# Patient Record
Sex: Female | Born: 1935 | Race: White | Hispanic: No | Smoking: Former smoker
Health system: Southern US, Community
[De-identification: ages and names within clinical notes are randomized; demographics above are authoritative.]

## PROBLEM LIST (undated history)

## (undated) DIAGNOSIS — F0390 Unspecified dementia without behavioral disturbance: Secondary | ICD-10-CM

## (undated) DIAGNOSIS — E785 Hyperlipidemia, unspecified: Secondary | ICD-10-CM

## (undated) DIAGNOSIS — J302 Other seasonal allergic rhinitis: Secondary | ICD-10-CM

## (undated) DIAGNOSIS — S069XAA Unspecified intracranial injury with loss of consciousness status unknown, initial encounter: Secondary | ICD-10-CM

## (undated) DIAGNOSIS — S069X9A Unspecified intracranial injury with loss of consciousness of unspecified duration, initial encounter: Secondary | ICD-10-CM

## (undated) DIAGNOSIS — F419 Anxiety disorder, unspecified: Secondary | ICD-10-CM

## (undated) DIAGNOSIS — E079 Disorder of thyroid, unspecified: Secondary | ICD-10-CM

## (undated) HISTORY — PX: ABDOMINAL HYSTERECTOMY: SHX81

---

## 2014-09-13 ENCOUNTER — Emergency Department: Payer: Self-pay | Admitting: Emergency Medicine

## 2014-09-13 LAB — CBC
HCT: 40.5 % (ref 35.0–47.0)
HGB: 13 g/dL (ref 12.0–16.0)
MCH: 29.5 pg (ref 26.0–34.0)
MCHC: 32 g/dL (ref 32.0–36.0)
MCV: 92 fL (ref 80–100)
Platelet: 325 10*3/uL (ref 150–440)
RBC: 4.4 10*6/uL (ref 3.80–5.20)
RDW: 13.9 % (ref 11.5–14.5)
WBC: 7.1 10*3/uL (ref 3.6–11.0)

## 2014-09-13 LAB — BASIC METABOLIC PANEL
ANION GAP: 7 (ref 7–16)
BUN: 21 mg/dL — ABNORMAL HIGH (ref 7–18)
CALCIUM: 8.2 mg/dL — AB (ref 8.5–10.1)
Chloride: 107 mmol/L (ref 98–107)
Co2: 25 mmol/L (ref 21–32)
Creatinine: 0.97 mg/dL (ref 0.60–1.30)
EGFR (Non-African Amer.): 58 — ABNORMAL LOW
Glucose: 83 mg/dL (ref 65–99)
Osmolality: 280 (ref 275–301)
Potassium: 4.3 mmol/L (ref 3.5–5.1)
SODIUM: 139 mmol/L (ref 136–145)

## 2014-09-13 LAB — PROTIME-INR
INR: 1.1
Prothrombin Time: 13.8 secs (ref 11.5–14.7)

## 2014-09-13 LAB — TROPONIN I: Troponin-I: <0.02 ng/mL

## 2017-12-01 ENCOUNTER — Emergency Department
Admission: EM | Admit: 2017-12-01 | Discharge: 2017-12-02 | Disposition: A | Discharge status: Home / Self Care | Payer: Medicare Other | Attending: Emergency Medicine | Admitting: Emergency Medicine

## 2017-12-01 ENCOUNTER — Encounter: Payer: Self-pay | Admitting: Emergency Medicine

## 2017-12-01 ENCOUNTER — Other Ambulatory Visit: Payer: Self-pay

## 2017-12-01 DIAGNOSIS — Z87891 Personal history of nicotine dependence: Secondary | ICD-10-CM | POA: Diagnosis not present

## 2017-12-01 DIAGNOSIS — R109 Unspecified abdominal pain: Secondary | ICD-10-CM | POA: Diagnosis present

## 2017-12-01 HISTORY — DX: Unspecified dementia, unspecified severity, without behavioral disturbance, psychotic disturbance, mood disturbance, and anxiety: F03.90

## 2017-12-01 HISTORY — DX: Unspecified intracranial injury with loss of consciousness status unknown, initial encounter: S06.9XAA

## 2017-12-01 HISTORY — DX: Hyperlipidemia, unspecified: E78.5

## 2017-12-01 HISTORY — DX: Disorder of thyroid, unspecified: E07.9

## 2017-12-01 HISTORY — DX: Unspecified intracranial injury with loss of consciousness of unspecified duration, initial encounter: S06.9X9A

## 2017-12-01 HISTORY — DX: Anxiety disorder, unspecified: F41.9

## 2017-12-01 HISTORY — DX: Other seasonal allergic rhinitis: J30.2

## 2017-12-01 LAB — CBC
HCT: 37.5 % (ref 35.0–47.0)
HEMOGLOBIN: 12.8 g/dL (ref 12.0–16.0)
MCH: 31 pg (ref 26.0–34.0)
MCHC: 34.2 g/dL (ref 32.0–36.0)
MCV: 90.8 fL (ref 80.0–100.0)
PLATELETS: 432 10*3/uL (ref 150–440)
RBC: 4.13 MIL/uL (ref 3.80–5.20)
RDW: 14.6 % — ABNORMAL HIGH (ref 11.5–14.5)
WBC: 7.7 10*3/uL (ref 3.6–11.0)

## 2017-12-01 LAB — COMPREHENSIVE METABOLIC PANEL
ALK PHOS: 71 U/L (ref 38–126)
ALT: 14 U/L (ref 14–54)
ANION GAP: 7 (ref 5–15)
AST: 19 U/L (ref 15–41)
Albumin: 3.9 g/dL (ref 3.5–5.0)
BILIRUBIN TOTAL: 0.8 mg/dL (ref 0.3–1.2)
BUN: 34 mg/dL — ABNORMAL HIGH (ref 6–20)
CALCIUM: 9 mg/dL (ref 8.9–10.3)
CO2: 25 mmol/L (ref 22–32)
CREATININE: 0.78 mg/dL (ref 0.44–1.00)
Chloride: 103 mmol/L (ref 101–111)
Glucose, Bld: 94 mg/dL (ref 65–99)
Potassium: 4.1 mmol/L (ref 3.5–5.1)
SODIUM: 135 mmol/L (ref 135–145)
TOTAL PROTEIN: 7.1 g/dL (ref 6.5–8.1)

## 2017-12-01 LAB — TROPONIN I

## 2017-12-01 LAB — LIPASE, BLOOD: Lipase: 42 U/L (ref 11–51)
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
## 2017-12-02 ENCOUNTER — Emergency Department: Payer: Medicare Other

## 2017-12-02 DIAGNOSIS — R109 Unspecified abdominal pain: Secondary | ICD-10-CM | POA: Diagnosis not present

## 2017-12-02 LAB — URINALYSIS, COMPLETE (UACMP) WITH MICROSCOPIC
Bilirubin Urine: NEGATIVE
Glucose, UA: NEGATIVE mg/dL
Hgb urine dipstick: NEGATIVE
KETONES UR: NEGATIVE mg/dL
LEUKOCYTES UA: NEGATIVE
NITRITE: NEGATIVE
PROTEIN: NEGATIVE mg/dL
SPECIFIC GRAVITY, URINE: 1.025 (ref 1.005–1.030)
Squamous Epithelial / LPF: NONE SEEN
pH: 5 (ref 5.0–8.0)

## 2017-12-02 MED ORDER — IOPAMIDOL (ISOVUE-300) INJECTION 61%
30.0000 mL | Freq: Once | INTRAVENOUS | Status: AC | PRN
  Administered 2017-12-02: 30 mL via ORAL

## 2017-12-02 MED ORDER — IOPAMIDOL (ISOVUE-300) INJECTION 61%
100.0000 mL | Freq: Once | INTRAVENOUS | Status: AC | PRN
  Administered 2017-12-02: 100 mL via INTRAVENOUS

## 2017-12-02 NOTE — ED Provider Notes (Signed)
[facility] Emergency Department Provider Note   First MD Initiated Contact with Patient 12/01/17 2357     (approximate)  I have reviewed the triage vital signs and the nursing notes.  Level 5 caveat: History limited secondary to dementia HISTORY  Chief Complaint Abdominal Pain   HPI Tonette Ledererherese Jacques is a 84 y.o. female with below list of chronic medical conditions presents to the emergency department 1 day history of abdominal pain of unknown location as per the patient's son.  The patient's son that the patient has had no vomiting however that the patient had diarrhea which resolved a day ago.  No fever afebrile on presentation unable to obtain a pain score secondary to dementia   Past Medical History:  Diagnosis Date  . Anxiety   . Dementia   . Hyperlipidemia   . Seasonal allergies   . TBI (traumatic brain injury) (HCC)   . Thyroid disease     There are no active problems to display for this patient.   Past Surgical History:  Procedure Laterality Date  . ABDOMINAL HYSTERECTOMY      Prior to Admission medications   Not on File    Allergies Sulfa antibiotics  No family history on file.  Social History Social History   Tobacco Use  . Smoking status: Former Games developermoker  . Smokeless tobacco: Never Used  Substance Use Topics  . Alcohol use: No    Frequency: Never  . Drug use: Not on file    Review of Systems Constitutional: No fever/chills Eyes: No visual changes. ENT: No sore throat. Cardiovascular: Denies chest pain. Respiratory: Denies shortness of breath. Gastrointestinal: No abdominal pain.  No nausea, no vomiting.  No diarrhea.  No constipation. Genitourinary: Negative for dysuria. Musculoskeletal: Negative for neck pain.  Negative for back pain. Integumentary: Negative for rash. Neurological: Negative for headaches, focal weakness or numbness.  ____________________________________________   PHYSICAL EXAM:  VITAL  SIGNS: ED Triage Vitals  Enc Vitals Group     BP 12/01/17 2059 (!) 158/95     Pulse Rate 12/01/17 2059 74     Resp 12/01/17 2059 18     Temp 12/01/17 2059 98.2 F (36.8 C)     Temp Source 12/01/17 2059 Oral     SpO2 12/01/17 2059 97 %     Weight 12/01/17 2054 63.5 kg (140 lb)     Height 12/01/17 2054 1.549 m (5\' 1" )     Head Circumference --      Peak Flow --      Pain Score --      Pain Loc --      Pain Edu? --      Excl. in GC? --     Constitutional: Alert Well appearing and in no acute distress. Eyes: Conjunctivae are normal.  Head: Atraumatic. Mouth/Throat: Mucous membranes are moist.  Oropharynx non-erythematous. Neck: No stridor.  Cardiovascular: Normal rate, regular rhythm. Good peripheral circulation. Grossly normal heart sounds. Respiratory: Normal respiratory effort.  No retractions. Lungs CTAB. Gastrointestinal: Soft and nontender. No distention.  Musculoskeletal: No lower extremity tenderness nor edema. No gross deformities of extremities. Neurologic:  Normal speech and language. No gross focal neurologic deficits are appreciated.  Skin:  Skin is warm, dry and intact. No rash noted. Psychiatric: Mood and affect are normal. Speech and behavior are normal.**}  ____________________________________________   LABS (all labs ordered are listed, but only abnormal results are displayed)  Labs Reviewed  COMPREHENSIVE METABOLIC PANEL - Abnormal; Notable for  the following components:      Result Value   BUN 34 (*)    All other components within normal limits  CBC - Abnormal; Notable for the following components:   RDW 14.6 (*)    All other components within normal limits  URINALYSIS, COMPLETE (UACMP) WITH MICROSCOPIC - Abnormal; Notable for the following components:   Color, Urine YELLOW (*)    APPearance CLEAR (*)    Bacteria, UA RARE (*)    All other components within normal limits  LIPASE, BLOOD  TROPONIN I    ____________________________________________  EKG  ED ECG REPORT I, [place] N BROWN, the attending physician, personally viewed and interpreted this ECG.   Date: 12/02/2017  EKG Time: 9:06 PM  Rate: 75  Rhythm: Normal sinus rhythm  Axis: Normal  Intervals: Normal  ST&T Change: None  ____________________________________________  RADIOLOGY I, [place] N BROWN, personally viewed and evaluated these images (plain radiographs) as part of my medical decision making, as well as reviewing the written report by the radiologist.  Ct Abdomen Pelvis W Contrast  Result Date: 12/02/2017 CLINICAL DATA:  Abdominal pain for a few days. EXAM: CT ABDOMEN AND PELVIS WITH CONTRAST TECHNIQUE: Multidetector CT imaging of the abdomen and pelvis was performed using the standard protocol following bolus administration of intravenous contrast. CONTRAST:  ISOVUE-300 IOPAMIDOL (ISOVUE-300) INJECTION 61% COMPARISON:  None. FINDINGS: Lower chest: Lung bases are clear. Hepatobiliary: No focal liver abnormality is seen. Status post cholecystectomy. No biliary dilatation. Pancreas: Unremarkable. No pancreatic ductal dilatation or surrounding inflammatory changes. Spleen: Normal in size without focal abnormality. Adrenals/Urinary Tract: No adrenal gland nodules. Subcentimeter cysts in the kidneys. Nephrograms are symmetrical and homogeneous. No hydronephrosis or hydroureter. Bladder wall is diffusely thickened. Multiple bladder diverticula are present. This may represent bladder wall hypertrophy due to outlet obstruction or neurogenic bladder. Cystitis could also have this appearance. Stomach/Bowel: Stomach is within normal limits. Appendix appears normal. No evidence of bowel wall thickening, distention, or inflammatory changes. Vascular/Lymphatic: Aortic atherosclerosis. No enlarged abdominal or pelvic lymph nodes. Reproductive: Status post hysterectomy. No adnexal masses. Other: No abdominal wall hernia or  abnormality. No abdominopelvic ascites. Musculoskeletal: Degenerative changes in the spine. Old fracture deformity of the left inferior pubic ramus. Old rib fractures. No destructive bone lesions. IMPRESSION: 1. Bladder wall hypertrophy with trabeculation and diverticular formation. This likely represents changes due to outlet obstruction or neurogenic bladder. Cystitis less likely. 2. No bowel obstruction or inflammation. 3. Aortic atherosclerosis. Electronically Signed   By: Burman Nieves M.D.   On: 12/02/2017 02:35    Procedures   ____________________________________________   INITIAL IMPRESSION / ASSESSMENT AND PLAN / ED COURSE  As part of my medical decision making, I reviewed the following data within the electronic MEDICAL RECORD [id number] year old female presenting with abdominal pain no clear etiology for the patient's abdominal pain identified laboratory data on revealed no acute abnormalities. ____________________________________________  FINAL CLINICAL IMPRESSION(S) / ED DIAGNOSES  Final diagnoses:  Abdominal pain, unspecified abdominal location     MEDICATIONS GIVEN DURING THIS VISIT:  Medications  iopamidol (ISOVUE-300) 61 % injection 30 mL (30 mLs Oral Contrast Given 12/02/17 0058)  iopamidol (ISOVUE-300) 61 % injection 100 mL (100 mLs Intravenous Contrast Given 12/02/17 0208)     ED Discharge Orders    None       Note:  This document was prepared using Dragon voice recognition software and may include unintentional dictation errors.    Darci Current, MD 12/02/17 [phone]

## 2017-12-12 DIAGNOSIS — Z87891 Personal history of nicotine dependence: Secondary | ICD-10-CM | POA: Diagnosis not present

## 2017-12-12 DIAGNOSIS — B349 Viral infection, unspecified: Secondary | ICD-10-CM | POA: Diagnosis not present

## 2017-12-12 DIAGNOSIS — F0390 Unspecified dementia without behavioral disturbance: Secondary | ICD-10-CM | POA: Insufficient documentation

## 2017-12-12 DIAGNOSIS — R1010 Upper abdominal pain, unspecified: Secondary | ICD-10-CM | POA: Diagnosis present

## 2017-12-12 LAB — CBC
HCT: 36.8 % (ref 35.0–47.0)
Hemoglobin: 12.5 g/dL (ref 12.0–16.0)
MCH: 31 pg (ref 26.0–34.0)
MCHC: 34 g/dL (ref 32.0–36.0)
MCV: 91.2 fL (ref 80.0–100.0)
PLATELETS: 419 10*3/uL (ref 150–440)
RBC: 4.03 MIL/uL (ref 3.80–5.20)
RDW: 14.7 % — AB (ref 11.5–14.5)
WBC: 7.3 10*3/uL (ref 3.6–11.0)

## 2017-12-12 LAB — COMPREHENSIVE METABOLIC PANEL
ALK PHOS: 78 U/L (ref 38–126)
ALT: 14 U/L (ref 14–54)
AST: 19 U/L (ref 15–41)
Albumin: 3.8 g/dL (ref 3.5–5.0)
Anion gap: 8 (ref 5–15)
BILIRUBIN TOTAL: 0.8 mg/dL (ref 0.3–1.2)
BUN: 37 mg/dL — AB (ref 6–20)
CO2: 27 mmol/L (ref 22–32)
CREATININE: 0.85 mg/dL (ref 0.44–1.00)
Calcium: 9 mg/dL (ref 8.9–10.3)
Chloride: 102 mmol/L (ref 101–111)
GFR calc Af Amer: >60 mL/min (ref >60)
Glucose, Bld: 95 mg/dL (ref 65–99)
Potassium: 4.5 mmol/L (ref 3.5–5.1)
Sodium: 137 mmol/L (ref 135–145)
TOTAL PROTEIN: 7.1 g/dL (ref 6.5–8.1)

## 2017-12-12 LAB — LIPASE, BLOOD: Lipase: 41 U/L (ref 11–51)

## 2017-12-12 NOTE — ED Triage Notes (Signed)
Per daughter-in-law, pt has been having epigastric pain, with a fever of 100.2.  Per daughter-in-law, she gave pt tylenol.  Denies vomiting or diarrhea at this time.  Pt is poor historian due to dementia, daughter-in-law able to provide most information.  Pt appears in NAD at this time.

## 2017-12-13 ENCOUNTER — Encounter: Payer: Self-pay | Admitting: Radiology

## 2017-12-13 ENCOUNTER — Emergency Department: Payer: Medicare Other

## 2017-12-13 ENCOUNTER — Emergency Department
Admission: EM | Admit: 2017-12-13 | Discharge: 2017-12-13 | Disposition: A | Discharge status: Home / Self Care | Payer: Medicare Other | Attending: Emergency Medicine | Admitting: Emergency Medicine

## 2017-12-13 DIAGNOSIS — B349 Viral infection, unspecified: Secondary | ICD-10-CM | POA: Diagnosis not present

## 2017-12-13 LAB — URINALYSIS, COMPLETE (UACMP) WITH MICROSCOPIC
Bacteria, UA: NONE SEEN
Bilirubin Urine: NEGATIVE
GLUCOSE, UA: NEGATIVE mg/dL
Hgb urine dipstick: NEGATIVE
KETONES UR: NEGATIVE mg/dL
Leukocytes, UA: NEGATIVE
Nitrite: NEGATIVE
Protein, ur: NEGATIVE mg/dL
SQUAMOUS EPITHELIAL / LPF: NONE SEEN
Specific Gravity, Urine: 1.024 (ref 1.005–1.030)
pH: 5 (ref 5.0–8.0)

## 2017-12-13 LAB — INFLUENZA PANEL BY PCR (TYPE A & B)
INFLAPCR: NEGATIVE
INFLBPCR: NEGATIVE

## 2017-12-13 LAB — LACTIC ACID, PLASMA: Lactic Acid, Venous: 1.2 mmol/L (ref 0.5–1.9)

## 2017-12-13 LAB — TROPONIN I

## 2017-12-13 MED ORDER — OSELTAMIVIR PHOSPHATE 6 MG/ML PO SUSR
30.0000 mg | Freq: Once | ORAL | Status: AC
  Administered 2017-12-13: 30 mg via ORAL
  Filled 2017-12-13: qty 12.5

## 2017-12-13 MED ORDER — OSELTAMIVIR PHOSPHATE 6 MG/ML PO SUSR: 30.0000 mg | Freq: Two times a day (BID) | ORAL | 0 refills | Status: AC

## 2017-12-13 MED ORDER — IOPAMIDOL (ISOVUE-300) INJECTION 61%
75.0000 mL | Freq: Once | INTRAVENOUS | Status: AC | PRN
  Administered 2017-12-13: 75 mL via INTRAVENOUS

## 2017-12-13 NOTE — Discharge Instructions (Signed)
I will call you back with the results of the flu test later on this morning.  Please follow-up in clinic within 2 days to establish care with primary care.  Return to the emergency department sooner for any new or worsening symptoms such as worsening fevers, chills, if she is not behaving normally, or for any other issues whatsoever.  It was a pleasure to take care of you today, and thank you for coming to our emergency department.  If you have any questions or concerns before leaving please ask the nurse to grab me and I'm more than happy to go through your aftercare instructions again.  If you were prescribed any opioid pain medication today such as Norco, Vicodin, Percocet, morphine, hydrocodone, or oxycodone please make sure you do not drive when you are taking this medication as it can alter your ability to drive safely.  If you have any concerns once you are home that you are not improving or are in fact getting worse before you can make it to your follow-up appointment, please do not hesitate to call 911 and come back for further evaluation.  Merrily Brittle, MD  Results for orders placed or performed during the hospital encounter of 12/13/17  Lipase, blood  Result Value Ref Range   Lipase 41 11 - 51 U/L  Comprehensive metabolic panel  Result Value Ref Range   Sodium 137 135 - 145 mmol/L   Potassium 4.5 3.5 - 5.1 mmol/L   Chloride 102 101 - 111 mmol/L   CO2 27 22 - 32 mmol/L   Glucose, Bld 95 65 - 99 mg/dL   BUN 37 (H) 6 - 20 mg/dL   Creatinine, Ser 1.61 0.44 - 1.00 mg/dL   Calcium 9.0 8.9 - 09.6 mg/dL   Total Protein 7.1 6.5 - 8.1 g/dL   Albumin 3.8 3.5 - 5.0 g/dL   AST 19 15 - 41 U/L   ALT 14 14 - 54 U/L   Alkaline Phosphatase 78 38 - 126 U/L   Total Bilirubin 0.8 0.3 - 1.2 mg/dL   GFR calc non Af Amer >60 >60 mL/min   GFR calc Af Amer >60 >60 mL/min   Anion gap 8 5 - 15  CBC  Result Value Ref Range   WBC 7.3 3.6 - 11.0 K/uL   RBC 4.03 3.80 - 5.20 MIL/uL   Hemoglobin 12.5  12.0 - 16.0 g/dL   HCT 04.5 40.9 - 81.1 %   MCV 91.2 80.0 - 100.0 fL   MCH 31.0 26.0 - 34.0 pg   MCHC 34.0 32.0 - 36.0 g/dL   RDW 91.4 (H) 78.2 - 95.6 %   Platelets 419 150 - 440 K/uL  Urinalysis, Complete w Microscopic  Result Value Ref Range   Color, Urine YELLOW (A) YELLOW   APPearance HAZY (A) CLEAR   Specific Gravity, Urine 1.024 1.005 - 1.030   pH 5.0 5.0 - 8.0   Glucose, UA NEGATIVE NEGATIVE mg/dL   Hgb urine dipstick NEGATIVE NEGATIVE   Bilirubin Urine NEGATIVE NEGATIVE   Ketones, ur NEGATIVE NEGATIVE mg/dL   Protein, ur NEGATIVE NEGATIVE mg/dL   Nitrite NEGATIVE NEGATIVE   Leukocytes, UA NEGATIVE NEGATIVE   RBC / HPF 6-30 0 - 5 RBC/hpf   WBC, UA 0-5 0 - 5 WBC/hpf   Bacteria, UA NONE SEEN NONE SEEN   Squamous Epithelial / LPF NONE SEEN NONE SEEN   Mucus PRESENT   Lactic acid, plasma  Result Value Ref Range   Lactic Acid,  Venous 1.2 0.5 - 1.9 mmol/L  Troponin I  Result Value Ref Range   Troponin I <0.03 <0.03 ng/mL   Ct Abdomen Pelvis W Contrast  Result Date: 12/13/2017 CLINICAL DATA:  Epigastric pain and fever EXAM: CT ABDOMEN AND PELVIS WITH CONTRAST TECHNIQUE: Multidetector CT imaging of the abdomen and pelvis was performed using the standard protocol following bolus administration of intravenous contrast. CONTRAST:  75mL ISOVUE-300 IOPAMIDOL (ISOVUE-300) INJECTION 61% COMPARISON:  12/02/2017 CT abdomen pelvis FINDINGS: Lower chest: Lung bases demonstrate no acute consolidation or pleural effusion. Coronary vessel calcification. Normal heart size. Hepatobiliary: No focal liver abnormality is seen. Status post cholecystectomy. No biliary dilatation. Pancreas: Unremarkable. No pancreatic ductal dilatation or surrounding inflammatory changes. Spleen: Normal in size without focal abnormality. Adrenals/Urinary Tract: Adrenal glands are within normal limits. Multiple cysts within the bilateral kidneys. No hydronephrosis. Bladder similar in appearance with multiple diverticula  and minimal wall thickening. Stomach/Bowel: Stomach is within normal limits. Appendix not well seen but no right lower quadrant inflammation. No evidence of bowel wall thickening, distention, or inflammatory changes. Vascular/Lymphatic: Extensive aortic atherosclerosis. No aneurysmal dilatation. No significantly enlarged lymph nodes. Reproductive: Uterus and bilateral adnexa are unremarkable. Other: Negative for free air or free fluid. Musculoskeletal: Old left inferior pubic ramus fracture. Degenerative changes and scoliosis of the spine. No acute or suspicious lesion IMPRESSION: 1. No CT evidence for acute intra-abdominal or pelvic abnormality. 2. Re demonstrated slightly thick-walled appearance of the bladder with multiple diverticula present Electronically Signed   By: Jasmine PangKim  Fujinaga M.D.   On: 12/13/2017 03:05   Ct Abdomen Pelvis W Contrast  Result Date: 12/02/2017 CLINICAL DATA:  Abdominal pain for a few days. EXAM: CT ABDOMEN AND PELVIS WITH CONTRAST TECHNIQUE: Multidetector CT imaging of the abdomen and pelvis was performed using the standard protocol following bolus administration of intravenous contrast. CONTRAST:  100mL ISOVUE-300 IOPAMIDOL (ISOVUE-300) INJECTION 61% COMPARISON:  None. FINDINGS: Lower chest: Lung bases are clear. Hepatobiliary: No focal liver abnormality is seen. Status post cholecystectomy. No biliary dilatation. Pancreas: Unremarkable. No pancreatic ductal dilatation or surrounding inflammatory changes. Spleen: Normal in size without focal abnormality. Adrenals/Urinary Tract: No adrenal gland nodules. Subcentimeter cysts in the kidneys. Nephrograms are symmetrical and homogeneous. No hydronephrosis or hydroureter. Bladder wall is diffusely thickened. Multiple bladder diverticula are present. This may represent bladder wall hypertrophy due to outlet obstruction or neurogenic bladder. Cystitis could also have this appearance. Stomach/Bowel: Stomach is within normal limits. Appendix  appears normal. No evidence of bowel wall thickening, distention, or inflammatory changes. Vascular/Lymphatic: Aortic atherosclerosis. No enlarged abdominal or pelvic lymph nodes. Reproductive: Status post hysterectomy. No adnexal masses. Other: No abdominal wall hernia or abnormality. No abdominopelvic ascites. Musculoskeletal: Degenerative changes in the spine. Old fracture deformity of the left inferior pubic ramus. Old rib fractures. No destructive bone lesions. IMPRESSION: 1. Bladder wall hypertrophy with trabeculation and diverticular formation. This likely represents changes due to outlet obstruction or neurogenic bladder. Cystitis less likely. 2. No bowel obstruction or inflammation. 3. Aortic atherosclerosis. Electronically Signed   By: Burman NievesWilliam  Stevens M.D.   On: 12/02/2017 02:35   Dg Chest Port 1 View  Result Date: 12/13/2017 CLINICAL DATA:  Epigastric pain and fever.  History of dementia. EXAM: PORTABLE CHEST 1 VIEW COMPARISON:  Chest radiograph September 13, 2014 FINDINGS: Cardiac silhouette is normal in size. Tortuous, possibly ectatic aorta. Calcified aortic knob. Mild chronic interstitial changes without pleural effusion or focal consolidation. Biapical pleural thickening. No pneumothorax. Status post thyroidectomy. Osteopenia. IMPRESSION: Mild chronic interstitial changes  without acute cardiopulmonary process. Aortic Atherosclerosis (ICD10-I70.0). Electronically Signed   By: Awilda Metro M.D.   On: 12/13/2017 03:29

## 2017-12-13 NOTE — ED Notes (Signed)
2 unsuccessful PIV insertion attempts made by Gregor Hamsebecca RN. Onalee Huaavid RN to bedside to attempt PIV insertion

## 2017-12-13 NOTE — ED Notes (Signed)
Called pharmacy to request medication 

## 2017-12-13 NOTE — ED Notes (Signed)
Two unsuccessful attempts at PIV insertion [facility]. Lurena Joinerebecca RN to bedside to attempt PIV insertion

## 2017-12-13 NOTE — ED Notes (Addendum)
Reviewed discharge instructions, follow-up care, and prescriptions with patient and family.Family/caregiver verbalized understanding of all information reviewed. Patient stable, with no distress noted at this time.

## 2017-12-13 NOTE — ED Notes (Signed)
ED Provider at bedside. 

## 2017-12-13 NOTE — ED Provider Notes (Signed)
[facility] Emergency Department Provider Note  ____________________________________________   First MD Initiated Contact with Patient 12/13/17 0210     (approximate)  I have reviewed the triage vital signs and the nursing notes.   HISTORY  Chief Complaint Abdominal Pain  5 exemption history limited by the patient's dementia  HPI Deanna Mills is a 84 y.o. female who comes to the emergency department with 1 day of low-grade fever to 100 degrees, a brief episode of left hand tingling, dry cough, and a brief episode of upper abdominal pain.  The patient herself has no complaints at this time.  History obtained from her family member at bedside.    Past Medical History:  Diagnosis Date  . Anxiety   . Dementia   . Hyperlipidemia   . Seasonal allergies   . TBI (traumatic brain injury) (HCC)   . Thyroid disease     There are no active problems to display for this patient.   Past Surgical History:  Procedure Laterality Date  . ABDOMINAL HYSTERECTOMY      Prior to Admission medications   Medication Sig Start Date End Date Taking? Authorizing Provider  oseltamivir (TAMIFLU) 6 MG/ML SUSR suspension Take 5 mLs (30 mg total) by mouth 2 (two) times daily for 5 days. 12/13/17 12/18/17  Merrily Brittle, MD    Allergies Sulfa antibiotics  No family history on file.  Social History Social History   Tobacco Use  . Smoking status: Former Games developer  . Smokeless tobacco: Never Used  Substance Use Topics  . Alcohol use: No    Frequency: Never  . Drug use: Not on file    Review of Systems Level 5 exemption history limited by the patient's dementia ____________________________________________   PHYSICAL EXAM:  VITAL SIGNS: ED Triage Vitals  Enc Vitals Group     BP 12/12/17 2222 (!) 145/75     Pulse Rate 12/12/17 2222 72     Resp 12/12/17 2222 18     Temp 12/12/17 2222 97.7 F (36.5 C)     Temp Source 12/12/17 2222 Oral     SpO2 12/12/17  2222 99 %     Weight 12/12/17 2223 140 lb (63.5 kg)     Height --      Head Circumference --      Peak Flow --      Pain Score --      Pain Loc --      Pain Edu? --      Excl. in GC? --     Constitutional: Pleasant cooperative no distress severe dementia Eyes: PERRL EOMI. mid range and brisk Head: Atraumatic. Nose: Mild rhinorrhea Mouth/Throat: No trismus Neck: No stridor.   Cardiovascular: Normal rate, regular rhythm. Grossly normal heart sounds.  Good peripheral circulation. Respiratory: Normal respiratory effort.  No retractions. Lungs CTAB and moving good air Gastrointestinal: Soft nontender no rebound no guarding no peritonitis no McBurney's tenderness negative Rovsing's no costovertebral tenderness Musculoskeletal: No lower extremity edema   Neurologic:  No gross focal neurologic deficits are appreciated. Skin:  Skin is warm, dry and intact. No rash noted. Psychiatric: Severe dementia.    ____________________________________________   DIFFERENTIAL includes but not limited to  Appendicitis, diverticulitis, pyelonephritis, nephrolithiasis, influenza, viral syndrome, acute coronary syndrome ____________________________________________   LABS (all labs ordered are listed, but only abnormal results are displayed)  Labs Reviewed  COMPREHENSIVE METABOLIC PANEL - Abnormal; Notable for the following components:      Result Value   BUN  37 (*)    All other components within normal limits  CBC - Abnormal; Notable for the following components:   RDW 14.7 (*)    All other components within normal limits  URINALYSIS, COMPLETE (UACMP) WITH MICROSCOPIC - Abnormal; Notable for the following components:   Color, Urine YELLOW (*)    APPearance HAZY (*)    All other components within normal limits  LIPASE, BLOOD  LACTIC ACID, PLASMA  TROPONIN I  INFLUENZA PANEL BY PCR (TYPE A & B)    Lab work reviewed by me with no acute  disease __________________________________________  EKG  ED ECG REPORT I, Merrily BrittleNeil Said Rueb, the attending physician, personally viewed and interpreted this ECG.  Date: 12/13/2017 EKG Time:  Rate: 74 Rhythm: normal sinus rhythm QRS Axis: normal Intervals: normal ST/T Wave abnormalities: normal Narrative Interpretation: no evidence of acute ischemia  ____________________________________________  RADIOLOGY  CT abdomen pelvis reviewed by me with no acute disease Chest x-ray reviewed by me with no acute disease ____________________________________________   PROCEDURES  Procedure(s) performed: no  Procedures  Critical Care performed: no  Observation: no ____________________________________________   INITIAL IMPRESSION / ASSESSMENT AND PLAN / ED COURSE  Pertinent labs & imaging results that were available during my care of the patient were reviewed by me and considered in my medical decision making (see chart for details).  The patient arrives with a low-grade fever although is very well-appearing with a benign exam.  Given her advanced age and advanced imaging of her abdomen obtained which is negative for acute pathology.  I had a lengthy discussion with the patient's family at bedside regarding the diagnostic uncertainty and that her low-grade fever rhinorrhea and dry cough could represent early influenza.  They opted for presumptive treatment now wall testing is pending.  ----------------------------------------- 6:57 AM on 12/13/2017 -----------------------------------------  I called family back to let them know that her influenza testing is negative and did not continue the Tamiflu.  I still believe the patient had viral syndrome.  She was able to eat and drink.  I reemphasized strict return precautions to family.      ____________________________________________   FINAL CLINICAL IMPRESSION(S) / ED DIAGNOSES  Final diagnoses:  Viral syndrome      NEW  MEDICATIONS STARTED DURING THIS VISIT:  Discharge Medication List as of 12/13/2017  4:04 AM    START taking these medications   Details  oseltamivir (TAMIFLU) 6 MG/ML SUSR suspension Take 5 mLs (30 mg total) by mouth 2 (two) times daily for 5 days., Starting Sun 12/13/2017, Until Fri 12/18/2017, Print         Note:  This document was prepared using Dragon voice recognition software and may include unintentional dictation errors.     Merrily Brittleifenbark, Arhianna Ebey, MD 12/13/17 [phone]

## 2017-12-13 NOTE — ED Notes (Signed)
Patient's son reports patient c/o abdominal pain and left hand numbness. Patient has hx of dementia. Patient currently denies abdominal pain and left hand numbness. Patient has equal strength/sensation to bilateral hands.

## 2018-01-30 ENCOUNTER — Other Ambulatory Visit: Payer: Self-pay

## 2018-01-30 ENCOUNTER — Encounter: Payer: Self-pay | Admitting: Emergency Medicine

## 2018-01-30 ENCOUNTER — Emergency Department: Payer: Medicare Other

## 2018-01-30 ENCOUNTER — Emergency Department
Admission: EM | Admit: 2018-01-30 | Discharge: 2018-01-31 | Disposition: A | Discharge status: Home / Self Care | Payer: Medicare Other | Attending: Emergency Medicine | Admitting: Emergency Medicine

## 2018-01-30 DIAGNOSIS — Z882 Allergy status to sulfonamides status: Secondary | ICD-10-CM | POA: Diagnosis not present

## 2018-01-30 DIAGNOSIS — N3090 Cystitis, unspecified without hematuria: Secondary | ICD-10-CM | POA: Diagnosis not present

## 2018-01-30 DIAGNOSIS — M5440 Lumbago with sciatica, unspecified side: Secondary | ICD-10-CM | POA: Diagnosis not present

## 2018-01-30 DIAGNOSIS — E079 Disorder of thyroid, unspecified: Secondary | ICD-10-CM | POA: Insufficient documentation

## 2018-01-30 DIAGNOSIS — M545 Low back pain: Secondary | ICD-10-CM

## 2018-01-30 DIAGNOSIS — Z87891 Personal history of nicotine dependence: Secondary | ICD-10-CM | POA: Diagnosis not present

## 2018-01-30 DIAGNOSIS — R0602 Shortness of breath: Secondary | ICD-10-CM | POA: Insufficient documentation

## 2018-01-30 DIAGNOSIS — J40 Bronchitis, not specified as acute or chronic: Secondary | ICD-10-CM

## 2018-01-30 LAB — URINALYSIS, COMPLETE (UACMP) WITH MICROSCOPIC
Bilirubin Urine: NEGATIVE
Glucose, UA: NEGATIVE mg/dL
HGB URINE DIPSTICK: NEGATIVE
Ketones, ur: NEGATIVE mg/dL
Nitrite: NEGATIVE
PROTEIN: NEGATIVE mg/dL
SQUAMOUS EPITHELIAL / LPF: NONE SEEN
Specific Gravity, Urine: 1.017 (ref 1.005–1.030)
pH: 5 (ref 5.0–8.0)

## 2018-01-30 LAB — COMPREHENSIVE METABOLIC PANEL
ALK PHOS: 63 U/L (ref 38–126)
ALT: 16 U/L (ref 14–54)
AST: 20 U/L (ref 15–41)
Albumin: 3.8 g/dL (ref 3.5–5.0)
Anion gap: 10 (ref 5–15)
BUN: 39 mg/dL — AB (ref 6–20)
CO2: 24 mmol/L (ref 22–32)
CREATININE: 0.76 mg/dL (ref 0.44–1.00)
Calcium: 9.3 mg/dL (ref 8.9–10.3)
Chloride: 105 mmol/L (ref 101–111)
Glucose, Bld: 136 mg/dL — ABNORMAL HIGH (ref 65–99)
Potassium: 4.3 mmol/L (ref 3.5–5.1)
Sodium: 139 mmol/L (ref 135–145)
TOTAL PROTEIN: 7.1 g/dL (ref 6.5–8.1)
Total Bilirubin: 1 mg/dL (ref 0.3–1.2)

## 2018-01-30 LAB — CBC WITH DIFFERENTIAL/PLATELET
BASOS ABS: 0.1 10*3/uL (ref 0–0.1)
Basophils Relative: 1 %
Eosinophils Absolute: 0.5 10*3/uL (ref 0–0.7)
Eosinophils Relative: 5 %
HEMATOCRIT: 39.7 % (ref 35.0–47.0)
Hemoglobin: 13.3 g/dL (ref 12.0–16.0)
LYMPHS ABS: 1 10*3/uL (ref 1.0–3.6)
LYMPHS PCT: 11 %
MCH: 31.2 pg (ref 26.0–34.0)
MCHC: 33.4 g/dL (ref 32.0–36.0)
MCV: 93.2 fL (ref 80.0–100.0)
MONO ABS: 1.4 10*3/uL — AB (ref 0.2–0.9)
Monocytes Relative: 14 %
NEUTROS ABS: 7 10*3/uL — AB (ref 1.4–6.5)
Neutrophils Relative %: 69 %
Platelets: 423 10*3/uL (ref 150–440)
RBC: 4.26 MIL/uL (ref 3.80–5.20)
RDW: 14.8 % — ABNORMAL HIGH (ref 11.5–14.5)
WBC: 10 10*3/uL (ref 3.6–11.0)

## 2018-01-30 LAB — TROPONIN I

## 2018-01-30 NOTE — ED Triage Notes (Signed)
Back pain no injury for unknown amount of time. Congestion, no fever,

## 2018-01-30 NOTE — ED Notes (Addendum)
Pt's family states they are not here for back pain, state they are here for leg swelling and shortness of breath. Pt with audible rhonchi noted in lower lung fields. Family states pt has had a dry cough, no vomiting, fever or diarrhea. Dr. Scotty CourtStafford notified of family's chief complaint. Pt with bilateral lower extremity swelling 3+ from toes to knees.

## 2018-01-30 NOTE — ED Notes (Signed)
Pt assisted with bedpan by ed tech.

## 2018-01-31 ENCOUNTER — Encounter: Payer: Self-pay | Admitting: Radiology

## 2018-01-31 ENCOUNTER — Emergency Department: Payer: Medicare Other

## 2018-01-31 DIAGNOSIS — R0602 Shortness of breath: Secondary | ICD-10-CM | POA: Diagnosis not present

## 2018-01-31 LAB — COMPREHENSIVE METABOLIC PANEL
ALBUMIN: 3.6 g/dL (ref 3.5–5.0)
ALT: 16 U/L (ref 14–54)
AST: 17 U/L (ref 15–41)
Alkaline Phosphatase: 60 U/L (ref 38–126)
Anion gap: 6 (ref 5–15)
BUN: 38 mg/dL — AB (ref 6–20)
CHLORIDE: 104 mmol/L (ref 101–111)
CO2: 29 mmol/L (ref 22–32)
CREATININE: 0.8 mg/dL (ref 0.44–1.00)
Calcium: 9 mg/dL (ref 8.9–10.3)
GFR calc Af Amer: >60 mL/min (ref >60)
GFR calc non Af Amer: >60 mL/min (ref >60)
Glucose, Bld: 106 mg/dL — ABNORMAL HIGH (ref 65–99)
POTASSIUM: 4.6 mmol/L (ref 3.5–5.1)
SODIUM: 139 mmol/L (ref 135–145)
Total Bilirubin: 0.9 mg/dL (ref 0.3–1.2)
Total Protein: 6.8 g/dL (ref 6.5–8.1)

## 2018-01-31 LAB — CBC WITH DIFFERENTIAL/PLATELET
BASOS ABS: 0.1 10*3/uL (ref 0–0.1)
BASOS PCT: 1 %
Eosinophils Absolute: 0.5 10*3/uL (ref 0–0.7)
Eosinophils Relative: 5 %
HEMATOCRIT: 37.3 % (ref 35.0–47.0)
HEMOGLOBIN: 12.4 g/dL (ref 12.0–16.0)
LYMPHS PCT: 11 %
Lymphs Abs: 1 10*3/uL (ref 1.0–3.6)
MCH: 30.7 pg (ref 26.0–34.0)
MCHC: 33.2 g/dL (ref 32.0–36.0)
MCV: 92.4 fL (ref 80.0–100.0)
MONO ABS: 1.5 10*3/uL — AB (ref 0.2–0.9)
MONOS PCT: 16 %
NEUTROS ABS: 6.3 10*3/uL (ref 1.4–6.5)
Neutrophils Relative %: 67 %
Platelets: 407 10*3/uL (ref 150–440)
RBC: 4.04 MIL/uL (ref 3.80–5.20)
RDW: 15.3 % — AB (ref 11.5–14.5)
WBC: 9.4 10*3/uL (ref 3.6–11.0)

## 2018-01-31 LAB — BRAIN NATRIURETIC PEPTIDE: B NATRIURETIC PEPTIDE 5: 22 pg/mL (ref 0.0–100.0)

## 2018-01-31 LAB — TROPONIN I: Troponin I: <0.03 ng/mL (ref <0.03)

## 2018-01-31 MED ORDER — ALBUTEROL SULFATE (2.5 MG/3ML) 0.083% IN NEBU
5.0000 mg | INHALATION_SOLUTION | Freq: Once | RESPIRATORY_TRACT | Status: AC
  Administered 2018-01-31: 5 mg via RESPIRATORY_TRACT
  Filled 2018-01-31: qty 6

## 2018-01-31 MED ORDER — CEPHALEXIN 500 MG PO CAPS
500.0000 mg | ORAL_CAPSULE | Freq: Once | ORAL | Status: AC
  Administered 2018-01-31: 500 mg via ORAL
  Filled 2018-01-31: qty 1

## 2018-01-31 MED ORDER — ALBUTEROL SULFATE HFA 108 (90 BASE) MCG/ACT IN AERS: 2.0000 | INHALATION_SPRAY | Freq: Four times a day (QID) | RESPIRATORY_TRACT | 0 refills | Status: AC | PRN

## 2018-01-31 MED ORDER — IOPAMIDOL (ISOVUE-370) INJECTION 76%
75.0000 mL | Freq: Once | INTRAVENOUS | Status: AC | PRN
  Administered 2018-01-31: 75 mL via INTRAVENOUS

## 2018-01-31 MED ORDER — CEPHALEXIN 500 MG PO CAPS: 500.0000 mg | ORAL_CAPSULE | Freq: Two times a day (BID) | ORAL | 0 refills | Status: DC

## 2018-01-31 NOTE — ED Notes (Signed)
Patient transported to CT 

## 2018-01-31 NOTE — ED Notes (Signed)
After assisting pt up to wheelchair to discharge, pt states "I can't breathe". Pt with noted with resp rate of 24, md notified and in to reassess.

## 2018-01-31 NOTE — ED Notes (Signed)
Family concerned to take pt home, however they have concerns with pt continuing to be observed and treated in the emergency department. It is very difficult to get a clear answer from pt and family at this time. Family states they have more questions for md, but are not able to identify to this rn specific questions. md notified.

## 2018-01-31 NOTE — ED Notes (Signed)
Pt reports no improvement in feeling of shob. md notified. Pt without wheezing auscultated. Family remains at bedside. Pt appears in no resp distress while at rest, however pt with noted tachypnea when exerted.

## 2018-01-31 NOTE — ED Provider Notes (Signed)
The patient was seen and evaluated by Dr. Scotty CourtStafford and discharge prior to my shift.  After the patient was discharged but prior to leaving the patient noted that she was more short of breath and uncomfortable.  History is challenging as the patient has severe dementia.  Dr. Scotty CourtStafford gave the patient a breathing treatment which did help but not completely resolve the symptoms.  The patient's family feels the patient is not back to her baseline and would like her reevaluated.  The patient is somewhat tachypneic and hypoxic to 95% on room air although her lungs are clear.  I am rechecking labs including a BNP this time and will CT her chest looking for pulmonary embolism.  ----------------------------------------- 3:28 AM on 01/31/2018 -----------------------------------------  BNP is negative.  Troponin negative.  EKG unchanged.  CT angiogram with no pneumothorax and no evidence of pulmonary embolism.  He is suggestive of bronchitis.  Now saturating 98% on room air.  I will add on albuterol.  Patient and family are comfortable being discharged home with primary care follow-up.   Merrily Brittleifenbark, Ronne Savoia, MD 01/31/18 [phone]

## 2018-01-31 NOTE — ED Provider Notes (Signed)
[facility] Department Provider Note  ____________________________________________  Time seen: Approximately 12:17 AM  I have reviewed the triage vital signs and the nursing notes.   HISTORY  Chief Complaint Back Pain  Level 5 Caveat: Portions of the History and Physical were unable to be obtained due to altered mental status  due to chronic dementia.   HPI Deanna Mills is a 84 y.o. female brought to the ED by family due to leg swelling and shortness of breath, worsening for the past 2 days. Patient complains of low back pain. No further history available. The family does report that the patient had a fall to the floor due to sitting down and missing a chair in the middle the night 4 days ago which may be causing her back pain. She has had increased urinary frequency.  No fever or cough. Has chronic leg swelling. No orthopnea.     Past Medical History:  Diagnosis Date  . Anxiety   . Dementia   . Hyperlipidemia   . Seasonal allergies   . TBI (traumatic brain injury) (HCC)   . Thyroid disease      There are no active problems to display for this patient.    Past Surgical History:  Procedure Laterality Date  . ABDOMINAL HYSTERECTOMY       Prior to Admission medications   Medication Sig Start Date End Date Taking? Authorizing Provider  cephALEXin (KEFLEX) 500 MG capsule Take 1 capsule (500 mg total) by mouth 2 (two) times daily. 01/31/18   Sharman Cheek, MD     Allergies Sulfa antibiotics   History reviewed. No pertinent family history.  Social History Social History   Tobacco Use  . Smoking status: Former Games developer  . Smokeless tobacco: Never Used  Substance Use Topics  . Alcohol use: No    Frequency: Never  . Drug use: Not on file    Review of Systems unable to reliably obtained due to altered mental status ____________________________________________   PHYSICAL EXAM:  VITAL SIGNS: ED Triage Vitals  Enc  Vitals Group     BP 01/30/18 2230 (!) 168/83     Pulse Rate 01/30/18 2105 86     Resp 01/30/18 2105 18     Temp 01/30/18 2105 97.9 F (36.6 C)     Temp Source 01/30/18 2105 Oral     SpO2 01/30/18 2105 95 %     Weight 01/30/18 2108 140 lb (63.5 kg)     Height 01/30/18 2108 5' (1.524 m)     Head Circumference --      Peak Flow --      Pain Score --      Pain Loc --      Pain Edu? --      Excl. in GC? --     Vital signs reviewed, nursing assessments reviewed.   Constitutional:   awake and alert. Not oriented.. Well appearing and in no distress. Eyes:   No scleral icterus.  EOMI. No nystagmus. No conjunctival pallor. PERRL. ENT   Head:   Normocephalic and atraumatic.   Nose:   No congestion/rhinnorhea.    Mouth/Throat:   MMM, no pharyngeal erythema. No peritonsillar mass.    Neck:   No meningismus. Full ROM.no JVD Hematological/Lymphatic/Immunilogical:   No cervical lymphadenopathy. Cardiovascular:   RRR. Symmetric bilateral radial and DP pulses.  No murmurs.  Respiratory:   Normal respiratory effort without tachypnea/retractions. Breath sounds are clear and equal bilaterally. No wheezes/rales/rhonchi. Gastrointestinal:   Soft  and nontender. Non distended. There is no CVA tenderness.  No rebound, rigidity, or guarding. Genitourinary:   deferred Musculoskeletal:   Normal range of motion in all extremities. No joint effusions.  No lower extremity tenderness. 1+ pitting edema bilaterally, symmetric Circumference. Nontender Neurologic:   normal speech, very limited language range..  Motor grossly intact. No acute focal neurologic deficits are appreciated.  Skin:    Skin is warm, dry and intact. No rash noted.  No petechiae, purpura, or bullae.  ____________________________________________    LABS (pertinent positives/negatives) (all labs ordered are listed, but only abnormal results are displayed) Labs Reviewed  CBC WITH DIFFERENTIAL/PLATELET - Abnormal; Notable for  the following components:      Result Value   RDW 14.8 (*)    Neutro Abs 7.0 (*)    Monocytes Absolute 1.4 (*)    All other components within normal limits  COMPREHENSIVE METABOLIC PANEL - Abnormal; Notable for the following components:   Glucose, Bld 136 (*)    BUN 39 (*)    All other components within normal limits  URINALYSIS, COMPLETE (UACMP) WITH MICROSCOPIC - Abnormal; Notable for the following components:   Color, Urine YELLOW (*)    APPearance HAZY (*)    Leukocytes, UA TRACE (*)    Bacteria, UA RARE (*)    All other components within normal limits  URINE CULTURE  TROPONIN I   ____________________________________________   EKG    ____________________________________________    RADIOLOGY  Dg Chest 1 View  Result Date: 01/30/2018 CLINICAL DATA:  84 y/o  F; shortness of breath. EXAM: CHEST 1 VIEW COMPARISON:  12/13/2017 chest radiograph FINDINGS: Normal cardiac silhouette. Aortic atherosclerosis with calcification. Stable reticular opacities of the lungs greatest at lung bases. Stable biapical pleuroparenchymal scarring. Surgical clips of lower neck. No pleural effusion or pneumothorax. No acute osseous abnormality is evident. IMPRESSION: Stable reticular opacities of the lungs, possibly chronic interstitial edema. No consolidation. Aortic atherosclerosis. Electronically Signed   By: Mitzi Hansen M.D.   On: 01/30/2018 23:09   Dg Lumbar Spine 2-3 Views  Result Date: 01/30/2018 CLINICAL DATA:  Low back pain of unknown chronicity. No known injury. EXAM: LUMBAR SPINE - 2-3 VIEW COMPARISON:  CT abdomen and pelvis 12/13/2017. FINDINGS: Convex left scoliosis is again seen. No malalignment. No fracture. Lower lumbar facet arthropathy is noted. Aortic atherosclerosis is seen. IMPRESSION: No acute abnormality. Convex left scoliosis and degenerative disease. Atherosclerosis. Electronically Signed   By: Drusilla Kanner M.D.   On: 01/30/2018 23:13     ____________________________________________   PROCEDURES Procedures  ____________________________________________    CLINICAL IMPRESSION / ASSESSMENT AND PLAN / ED COURSE  Pertinent labs & imaging results that were available during my care of the patient were reviewed by me and considered in my medical decision making (see chart for details).   patient well-appearing no acute distress. Brought to the ED for suspected shortness of breath. Exam is reassuring. Vital signs are normal. Patient is calm and comfortable without evidence of acute pulmonary edema. I doubt pneumonia and pneumothorax pericarditis ACS or pulmonary embolism. Chest x-ray is clear. Lumbar x-ray shows no acute injury. Labs are unremarkable although urinalysis does reveal a urinary tract infection. I'll treat her with Keflex. Urine culture sent. Follow-up with primary care.plan of care discussed with family who agree      ____________________________________________   FINAL CLINICAL IMPRESSION(S) / ED DIAGNOSES    Final diagnoses:  Cystitis  Acute low back pain, unspecified back pain laterality, with sciatica presence unspecified  Shortness of breath     ED Discharge Orders        Ordered    cephALEXin (KEFLEX) 500 MG capsule  2 times daily     01/31/18 0017      Portions of this note were generated with dragon dictation software. Dictation errors may occur despite best attempts at proofreading.    Sharman CheekStafford, Thorvald Orsino, MD 01/31/18 Aretha [id number]

## 2018-01-31 NOTE — ED Notes (Signed)
Breathing treatment administered to patient at the request of primary nurse. Patient repositioned in bed to facilitate treatment.

## 2018-01-31 NOTE — Discharge Instructions (Addendum)
Please take all of your antibiotics as prescribed and use your inhaler as needed for shortness of breath.  Follow-up with your primary care physician in 2 days for reevaluation and return to the emergency department sooner for any concerns.  It was a pleasure to take care of you today, and thank you for coming to our emergency department.  If you have any questions or concerns before leaving please ask the nurse to grab me and I'm more than happy to go through your aftercare instructions again.  If you were prescribed any opioid pain medication today such as Norco, Vicodin, Percocet, morphine, hydrocodone, or oxycodone please make sure you do not drive when you are taking this medication as it can alter your ability to drive safely.  If you have any concerns once you are home that you are not improving or are in fact getting worse before you can make it to your follow-up appointment, please do not hesitate to call 911 and come back for further evaluation.  Merrily Brittle, MD  Results for orders placed or performed during the hospital encounter of 01/30/18  CBC with Differential  Result Value Ref Range   WBC 10.0 3.6 - 11.0 K/uL   RBC 4.26 3.80 - 5.20 MIL/uL   Hemoglobin 13.3 12.0 - 16.0 g/dL   HCT 40.9 81.1 - 91.4 %   MCV 93.2 80.0 - 100.0 fL   MCH 31.2 26.0 - 34.0 pg   MCHC 33.4 32.0 - 36.0 g/dL   RDW 78.2 (H) 95.6 - 21.3 %   Platelets 423 150 - 440 K/uL   Neutrophils Relative % 69 %   Neutro Abs 7.0 (H) 1.4 - 6.5 K/uL   Lymphocytes Relative 11 %   Lymphs Abs 1.0 1.0 - 3.6 K/uL   Monocytes Relative 14 %   Monocytes Absolute 1.4 (H) 0.2 - 0.9 K/uL   Eosinophils Relative 5 %   Eosinophils Absolute 0.5 0 - 0.7 K/uL   Basophils Relative 1 %   Basophils Absolute 0.1 0 - 0.1 K/uL  Comprehensive metabolic panel  Result Value Ref Range   Sodium 139 135 - 145 mmol/L   Potassium 4.3 3.5 - 5.1 mmol/L   Chloride 105 101 - 111 mmol/L   CO2 24 22 - 32 mmol/L   Glucose, Bld 136 (H) 65 - 99  mg/dL   BUN 39 (H) 6 - 20 mg/dL   Creatinine, Ser 0.86 0.44 - 1.00 mg/dL   Calcium 9.3 8.9 - 57.8 mg/dL   Total Protein 7.1 6.5 - 8.1 g/dL   Albumin 3.8 3.5 - 5.0 g/dL   AST 20 15 - 41 U/L   ALT 16 14 - 54 U/L   Alkaline Phosphatase 63 38 - 126 U/L   Total Bilirubin 1.0 0.3 - 1.2 mg/dL   GFR calc non Af Amer >60 >60 mL/min   GFR calc Af Amer >60 >60 mL/min   Anion gap 10 5 - 15  Troponin I  Result Value Ref Range   Troponin I <0.03 <0.03 ng/mL  Urinalysis, Complete w Microscopic  Result Value Ref Range   Color, Urine YELLOW (A) YELLOW   APPearance HAZY (A) CLEAR   Specific Gravity, Urine 1.017 1.005 - 1.030   pH 5.0 5.0 - 8.0   Glucose, UA NEGATIVE NEGATIVE mg/dL   Hgb urine dipstick NEGATIVE NEGATIVE   Bilirubin Urine NEGATIVE NEGATIVE   Ketones, ur NEGATIVE NEGATIVE mg/dL   Protein, ur NEGATIVE NEGATIVE mg/dL   Nitrite NEGATIVE NEGATIVE  Leukocytes, UA TRACE (A) NEGATIVE   RBC / HPF 0-5 0 - 5 RBC/hpf   WBC, UA 6-30 0 - 5 WBC/hpf   Bacteria, UA RARE (A) NONE SEEN   Squamous Epithelial / LPF NONE SEEN NONE SEEN   WBC Clumps PRESENT    Mucus PRESENT    Hyaline Casts, UA PRESENT   Comprehensive metabolic panel  Result Value Ref Range   Sodium 139 135 - 145 mmol/L   Potassium 4.6 3.5 - 5.1 mmol/L   Chloride 104 101 - 111 mmol/L   CO2 29 22 - 32 mmol/L   Glucose, Bld 106 (H) 65 - 99 mg/dL   BUN 38 (H) 6 - 20 mg/dL   Creatinine, Ser 1.610.80 0.44 - 1.00 mg/dL   Calcium 9.0 8.9 - 09.610.3 mg/dL   Total Protein 6.8 6.5 - 8.1 g/dL   Albumin 3.6 3.5 - 5.0 g/dL   AST 17 15 - 41 U/L   ALT 16 14 - 54 U/L   Alkaline Phosphatase 60 38 - 126 U/L   Total Bilirubin 0.9 0.3 - 1.2 mg/dL   GFR calc non Af Amer >60 >60 mL/min   GFR calc Af Amer >60 >60 mL/min   Anion gap 6 5 - 15  Brain natriuretic peptide  Result Value Ref Range   B Natriuretic Peptide 22.0 0.0 - 100.0 pg/mL  Troponin I  Result Value Ref Range   Troponin I <0.03 <0.03 ng/mL  CBC with Differential  Result Value Ref  Range   WBC 9.4 3.6 - 11.0 K/uL   RBC 4.04 3.80 - 5.20 MIL/uL   Hemoglobin 12.4 12.0 - 16.0 g/dL   HCT 04.537.3 40.935.0 - 81.147.0 %   MCV 92.4 80.0 - 100.0 fL   MCH 30.7 26.0 - 34.0 pg   MCHC 33.2 32.0 - 36.0 g/dL   RDW 91.415.3 (H) 78.211.5 - 95.614.5 %   Platelets 407 150 - 440 K/uL   Neutrophils Relative % 67 %   Neutro Abs 6.3 1.4 - 6.5 K/uL   Lymphocytes Relative 11 %   Lymphs Abs 1.0 1.0 - 3.6 K/uL   Monocytes Relative 16 %   Monocytes Absolute 1.5 (H) 0.2 - 0.9 K/uL   Eosinophils Relative 5 %   Eosinophils Absolute 0.5 0 - 0.7 K/uL   Basophils Relative 1 %   Basophils Absolute 0.1 0 - 0.1 K/uL   Dg Chest 1 View  Result Date: 01/30/2018 CLINICAL DATA:  84 y/o  F; shortness of breath. EXAM: CHEST 1 VIEW COMPARISON:  12/13/2017 chest radiograph FINDINGS: Normal cardiac silhouette. Aortic atherosclerosis with calcification. Stable reticular opacities of the lungs greatest at lung bases. Stable biapical pleuroparenchymal scarring. Surgical clips of lower neck. No pleural effusion or pneumothorax. No acute osseous abnormality is evident. IMPRESSION: Stable reticular opacities of the lungs, possibly chronic interstitial edema. No consolidation. Aortic atherosclerosis. Electronically Signed   By: Mitzi HansenLance  Furusawa-Stratton M.D.   On: 01/30/2018 23:09   Dg Lumbar Spine 2-3 Views  Result Date: 01/30/2018 CLINICAL DATA:  Low back pain of unknown chronicity. No known injury. EXAM: LUMBAR SPINE - 2-3 VIEW COMPARISON:  CT abdomen and pelvis 12/13/2017. FINDINGS: Convex left scoliosis is again seen. No malalignment. No fracture. Lower lumbar facet arthropathy is noted. Aortic atherosclerosis is seen. IMPRESSION: No acute abnormality. Convex left scoliosis and degenerative disease. Atherosclerosis. Electronically Signed   By: Drusilla Kannerhomas  Dalessio M.D.   On: 01/30/2018 23:13   Ct Angio Chest Pe W/cm &/or Wo Cm  Result Date:  01/31/2018 CLINICAL DATA:  84 y/o F; leg swelling and shortness of breath. Audible rhonchi and dry  cough. PE suspected. EXAM: CT ANGIOGRAPHY CHEST WITH CONTRAST TECHNIQUE: Multidetector CT imaging of the chest was performed using the standard protocol during bolus administration of intravenous contrast. Multiplanar CT image reconstructions and MIPs were obtained to evaluate the vascular anatomy. CONTRAST:  75mL ISOVUE-370 IOPAMIDOL (ISOVUE-370) INJECTION 76% COMPARISON:  01/30/2018 chest radiograph. FINDINGS: Cardiovascular: Satisfactory opacification of the pulmonary arteries to the segmental level. No evidence of pulmonary embolism. Normal heart size. No pericardial effusion. Mild calcific atherosclerosis of thoracic aorta. Mediastinum/Nodes: No enlarged mediastinal, hilar, or axillary lymph nodes. Thyroid gland, trachea, and esophagus demonstrate no significant findings. Lungs/Pleura: Lungs are clear. No pleural effusion or pneumothorax. Mosaic attenuation of the lungs. Peribronchial thickening. Upper Abdomen: 7 mm cyst arising from upper pole of right kidney. Musculoskeletal: Right 3-6 anterolateral rib fractures without appreciable callus. T4 and T6 moderate compression deformities. No significant bony retropulsion. Review of the MIP images confirms the above findings. IMPRESSION: 1. Respiratory motion artifact in lung bases. No pulmonary embolus identified. 2. Mosaic attenuation of the lungs likely representing air trapping. Peribronchial thickening may represent infectious/inflammatory bronchitis or reactive airways disease. 3. Right 3-6 anterolateral nondisplaced rib fractures as well as T4 and T6 moderate compression deformities, age indeterminate Electronically Signed   By: Mitzi Hansen M.D.   On: 01/31/2018 03:19

## 2018-02-02 LAB — URINE CULTURE

## 2018-02-03 NOTE — Progress Notes (Signed)
ED Antimicrobial Stewardship Positive Culture Follow Up   Deanna Mills is an 84 y.o. female who presented to [facility] with a chief complaint of  Chief Complaint  Patient presents with  . Back Pain    Recent Results (from the past 720 hour(s))  Urine culture     Status: Abnormal   Collection Time: 01/30/18 11:38 PM  Result Value Ref Range Status   Specimen Description   Final    URINE, RANDOM Performed at [facility], [address] [place], [place] [address]    Special Requests   Final    NONE Performed at [facility], [address] [place], [place] [address]    Culture (A)  Final    >=100,000 COLONIES/mL AEROCOCCUS URINAE Standardized susceptibility testing for this organism is not available. Performed at [facility], [address] [place], [place] [address]    Report Status 02/02/2018 FINAL  Final    [x]  Treated with Cephalexin , organism resistant to prescribed antimicrobial.  New antibiotic prescription: Amoxicillin 500mg  BID x 7 days    #14 capsules   ED Provider: Sharman CheekPhillip Stafford, MD  Spoke with patient's son, Deanna Mills, and discussed Cx results and Abx plan. Per the son's request, the new RX was called into [facility] [address] in [place] @ 1340 on 3/6.    Deanna Mills ,PHARMD, BCPS Clinical Pharmacist  02/03/2018, 1:42 PM

## 2018-05-14 ENCOUNTER — Inpatient Hospital Stay
Admission: EM | Admit: 2018-05-14 | Discharge: 2018-05-17 | DRG: 640 | Disposition: A | Discharge status: Skilled Nursing Facility | Payer: Medicare Other | Attending: Specialist | Admitting: Specialist

## 2018-05-14 ENCOUNTER — Emergency Department: Payer: Medicare Other

## 2018-05-14 ENCOUNTER — Other Ambulatory Visit: Payer: Self-pay

## 2018-05-14 DIAGNOSIS — L89320 Pressure ulcer of left buttock, unstageable: Secondary | ICD-10-CM | POA: Diagnosis present

## 2018-05-14 DIAGNOSIS — S069X0S Unspecified intracranial injury without loss of consciousness, sequela: Secondary | ICD-10-CM

## 2018-05-14 DIAGNOSIS — Z882 Allergy status to sulfonamides status: Secondary | ICD-10-CM

## 2018-05-14 DIAGNOSIS — B9689 Other specified bacterial agents as the cause of diseases classified elsewhere: Secondary | ICD-10-CM | POA: Diagnosis present

## 2018-05-14 DIAGNOSIS — E785 Hyperlipidemia, unspecified: Secondary | ICD-10-CM | POA: Diagnosis present

## 2018-05-14 DIAGNOSIS — Z7982 Long term (current) use of aspirin: Secondary | ICD-10-CM | POA: Diagnosis not present

## 2018-05-14 DIAGNOSIS — Z7901 Long term (current) use of anticoagulants: Secondary | ICD-10-CM

## 2018-05-14 DIAGNOSIS — E870 Hyperosmolality and hypernatremia: Principal | ICD-10-CM | POA: Diagnosis present

## 2018-05-14 DIAGNOSIS — L89322 Pressure ulcer of left buttock, stage 2: Secondary | ICD-10-CM | POA: Diagnosis present

## 2018-05-14 DIAGNOSIS — Z79890 Hormone replacement therapy (postmenopausal): Secondary | ICD-10-CM | POA: Diagnosis not present

## 2018-05-14 DIAGNOSIS — E860 Dehydration: Secondary | ICD-10-CM | POA: Diagnosis present

## 2018-05-14 DIAGNOSIS — Z86718 Personal history of other venous thrombosis and embolism: Secondary | ICD-10-CM | POA: Diagnosis not present

## 2018-05-14 DIAGNOSIS — N390 Urinary tract infection, site not specified: Secondary | ICD-10-CM | POA: Diagnosis present

## 2018-05-14 DIAGNOSIS — L89610 Pressure ulcer of right heel, unstageable: Secondary | ICD-10-CM | POA: Diagnosis present

## 2018-05-14 DIAGNOSIS — R4182 Altered mental status, unspecified: Secondary | ICD-10-CM | POA: Diagnosis present

## 2018-05-14 DIAGNOSIS — L8990 Pressure ulcer of unspecified site, unspecified stage: Secondary | ICD-10-CM

## 2018-05-14 DIAGNOSIS — G309 Alzheimer's disease, unspecified: Secondary | ICD-10-CM | POA: Diagnosis present

## 2018-05-14 DIAGNOSIS — L89620 Pressure ulcer of left heel, unstageable: Secondary | ICD-10-CM | POA: Diagnosis present

## 2018-05-14 DIAGNOSIS — G9341 Metabolic encephalopathy: Secondary | ICD-10-CM | POA: Diagnosis present

## 2018-05-14 DIAGNOSIS — Z66 Do not resuscitate: Secondary | ICD-10-CM | POA: Diagnosis present

## 2018-05-14 DIAGNOSIS — R410 Disorientation, unspecified: Secondary | ICD-10-CM

## 2018-05-14 DIAGNOSIS — I248 Other forms of acute ischemic heart disease: Secondary | ICD-10-CM | POA: Diagnosis present

## 2018-05-14 DIAGNOSIS — E039 Hypothyroidism, unspecified: Secondary | ICD-10-CM | POA: Diagnosis present

## 2018-05-14 DIAGNOSIS — Z87891 Personal history of nicotine dependence: Secondary | ICD-10-CM | POA: Diagnosis not present

## 2018-05-14 DIAGNOSIS — Z87820 Personal history of traumatic brain injury: Secondary | ICD-10-CM | POA: Diagnosis not present

## 2018-05-14 DIAGNOSIS — X58XXXS Exposure to other specified factors, sequela: Secondary | ICD-10-CM | POA: Diagnosis present

## 2018-05-14 DIAGNOSIS — Z79899 Other long term (current) drug therapy: Secondary | ICD-10-CM

## 2018-05-14 DIAGNOSIS — F0280 Dementia in other diseases classified elsewhere without behavioral disturbance: Secondary | ICD-10-CM | POA: Diagnosis present

## 2018-05-14 DIAGNOSIS — Z90710 Acquired absence of both cervix and uterus: Secondary | ICD-10-CM

## 2018-05-14 LAB — URINALYSIS, COMPLETE (UACMP) WITH MICROSCOPIC
Bilirubin Urine: NEGATIVE
Glucose, UA: NEGATIVE mg/dL
Ketones, ur: NEGATIVE mg/dL
Leukocytes, UA: NEGATIVE
Nitrite: NEGATIVE
PROTEIN: NEGATIVE mg/dL
SQUAMOUS EPITHELIAL / LPF: NONE SEEN (ref 0–5)
Specific Gravity, Urine: 1.024 (ref 1.005–1.030)
pH: 5 (ref 5.0–8.0)

## 2018-05-14 LAB — CBC WITH DIFFERENTIAL/PLATELET
BASOS PCT: 0 %
Basophils Absolute: 0.1 10*3/uL (ref 0–0.1)
EOS PCT: 2 %
Eosinophils Absolute: 0.2 10*3/uL (ref 0–0.7)
HEMATOCRIT: 43 % (ref 35.0–47.0)
Hemoglobin: 14.1 g/dL (ref 12.0–16.0)
LYMPHS PCT: 10 %
Lymphs Abs: 1.5 10*3/uL (ref 1.0–3.6)
MCH: 31 pg (ref 26.0–34.0)
MCHC: 32.7 g/dL (ref 32.0–36.0)
MCV: 94.7 fL (ref 80.0–100.0)
MONO ABS: 1.9 10*3/uL — AB (ref 0.2–0.9)
MONOS PCT: 13 %
NEUTROS ABS: 11.1 10*3/uL — AB (ref 1.4–6.5)
Neutrophils Relative %: 75 %
PLATELETS: 352 10*3/uL (ref 150–440)
RBC: 4.54 MIL/uL (ref 3.80–5.20)
RDW: 15.8 % — AB (ref 11.5–14.5)
WBC: 14.8 10*3/uL — ABNORMAL HIGH (ref 3.6–11.0)

## 2018-05-14 LAB — COMPREHENSIVE METABOLIC PANEL
ALK PHOS: 65 U/L (ref 38–126)
ALT: 66 U/L — ABNORMAL HIGH (ref 14–54)
ANION GAP: 9 (ref 5–15)
AST: 41 U/L (ref 15–41)
Albumin: 3.3 g/dL — ABNORMAL LOW (ref 3.5–5.0)
BUN: 59 mg/dL — ABNORMAL HIGH (ref 6–20)
CALCIUM: 8.8 mg/dL — AB (ref 8.9–10.3)
CO2: 26 mmol/L (ref 22–32)
Chloride: 119 mmol/L — ABNORMAL HIGH (ref 101–111)
Creatinine, Ser: 1.03 mg/dL — ABNORMAL HIGH (ref 0.44–1.00)
GFR calc non Af Amer: 48 mL/min — ABNORMAL LOW (ref >60)
GFR, EST AFRICAN AMERICAN: 55 mL/min — AB (ref >60)
Glucose, Bld: 110 mg/dL — ABNORMAL HIGH (ref 65–99)
POTASSIUM: 3.2 mmol/L — AB (ref 3.5–5.1)
SODIUM: 154 mmol/L — AB (ref 135–145)
Total Bilirubin: 2.6 mg/dL — ABNORMAL HIGH (ref 0.3–1.2)
Total Protein: 6.9 g/dL (ref 6.5–8.1)

## 2018-05-14 LAB — TROPONIN I
TROPONIN I: 0.06 ng/mL — AB (ref <0.03)
Troponin I: 0.05 ng/mL (ref <0.03)
Troponin I: 0.05 ng/mL (ref <0.03)

## 2018-05-14 LAB — APTT: aPTT: 32 seconds (ref 24–36)

## 2018-05-14 LAB — PROTIME-INR
INR: 2.12
PROTHROMBIN TIME: 23.6 s — AB (ref 11.4–15.2)

## 2018-05-14 LAB — SODIUM, URINE, RANDOM: SODIUM UR: 12 mmol/L

## 2018-05-14 LAB — PROCALCITONIN: PROCALCITONIN: 0.14 ng/mL

## 2018-05-14 LAB — SODIUM
SODIUM: 156 mmol/L — AB (ref 135–145)
SODIUM: 156 mmol/L — AB (ref 135–145)
Sodium: 156 mmol/L — ABNORMAL HIGH (ref 135–145)

## 2018-05-14 LAB — LACTIC ACID, PLASMA: Lactic Acid, Venous: 1.7 mmol/L (ref 0.5–1.9)

## 2018-05-14 LAB — LIPASE, BLOOD: Lipase: 49 U/L (ref 11–51)

## 2018-05-14 LAB — INFLUENZA PANEL BY PCR (TYPE A & B)
Influenza A By PCR: NEGATIVE
Influenza B By PCR: NEGATIVE

## 2018-05-14 LAB — CHLORIDE, URINE, RANDOM

## 2018-05-14 MED ORDER — ORAL CARE MOUTH RINSE
15.0000 mL | Freq: Two times a day (BID) | OROMUCOSAL | Status: DC
  Administered 2018-05-15 – 2018-05-16 (×4): 15 mL via OROMUCOSAL

## 2018-05-14 MED ORDER — POTASSIUM CHLORIDE IN NACL 20-0.45 MEQ/L-% IV SOLN
INTRAVENOUS | Status: DC
  Administered 2018-05-14: 15:00:00 via INTRAVENOUS
  Filled 2018-05-14 (×4): qty 1000

## 2018-05-14 MED ORDER — ENOXAPARIN SODIUM 80 MG/0.8ML ~~LOC~~ SOLN
1.0000 mg/kg | Freq: Two times a day (BID) | SUBCUTANEOUS | Status: DC
  Administered 2018-05-14 – 2018-05-15 (×2): 75 mg via SUBCUTANEOUS
  Filled 2018-05-14 (×2): qty 0.8

## 2018-05-14 MED ORDER — SODIUM CHLORIDE 0.9 % IV SOLN
1.0000 g | Freq: Once | INTRAVENOUS | Status: AC
  Administered 2018-05-14: 1 g via INTRAVENOUS
  Filled 2018-05-14: qty 10

## 2018-05-14 MED ORDER — ENOXAPARIN SODIUM 40 MG/0.4ML ~~LOC~~ SOLN: 40.0000 mg | SUBCUTANEOUS | Status: DC

## 2018-05-14 MED ORDER — ONDANSETRON HCL 4 MG PO TABS: 4.0000 mg | ORAL_TABLET | Freq: Four times a day (QID) | ORAL | Status: DC | PRN

## 2018-05-14 MED ORDER — SODIUM CHLORIDE 0.9 % IV BOLUS
1000.0000 mL | Freq: Once | INTRAVENOUS | Status: AC
  Administered 2018-05-14: 1000 mL via INTRAVENOUS

## 2018-05-14 MED ORDER — ONDANSETRON HCL 4 MG/2ML IJ SOLN: 4.0000 mg | Freq: Four times a day (QID) | INTRAMUSCULAR | Status: DC | PRN

## 2018-05-14 NOTE — Progress Notes (Signed)
Family Meeting Note  Advance Directive:yes  Today a meeting took place with the Patient, 2 sons at bedside  Patient is unable to participate due ZO:[id number]:Lacked capacity Altered mental status on baseline dementia   The following clinical team members were present during this meeting:MD  The following were discussed:Patient's diagnosis: Hyponatremia, dehydration, tachycardia , difficulty swallowing, delirium on baseline dementia, leukocytosis, other comorbidities as document below, treatment plan of care discussed in detail with the patient's 2 sons at bedside.  They both verbalized understanding of the plan.   Anxiety   . Dementia   . Hyperlipidemia   . Seasonal allergies   . TBI (traumatic brain injury) (HCC)   . Thyroid disease         Patient's progosis: Unable to determine and Goals for treatment: DNR, power of attorney Bluford KaufmannLeroy Menzie  Additional follow-up to be provided: Hospitalist  Time spent during discussion:18 MIN  Ramonita LabAruna Dianca Owensby, MD

## 2018-05-14 NOTE — H&P (Signed)
[facility] -  at [facility]   PATIENT NAME: Deanna Mills    MR#:  [id number]  DATE OF BIRTH:  09/02/1930  DATE OF ADMISSION:  05/14/2018  PRIMARY CARE PHYSICIAN: System, Pcp Not In   REQUESTING/REFERRING PHYSICIAN: Scotty Court  CHIEF COMPLAINT:  Generalized weakness, worsening of mental status  HISTORY OF PRESENT ILLNESS:  Deanna Mills  is a 84 y.o. female with a known history of dementia from traumatic brain injury, anxiety, hypothyroidism is brought into the ED by her sons for generalized weakness which has been getting worse and also also for altered mental status.  Son is reporting that patient has been having some choking spells lately.  CT head with chronic changes, sodium is at 154 troponin is elevated 0.06 and potassium at 3.2.  Hospitalist team was called to admit the patient.  Patient has leukocytosis with chest x-ray and urinalysis are negative  PAST MEDICAL HISTORY:   Past Medical History:  Diagnosis Date  . Anxiety   . Dementia   . Hyperlipidemia   . Seasonal allergies   . TBI (traumatic brain injury) (HCC)   . Thyroid disease     PAST SURGICAL HISTOIRY:   Past Surgical History:  Procedure Laterality Date  . ABDOMINAL HYSTERECTOMY      SOCIAL HISTORY:   Social History   Tobacco Use  . Smoking status: Former Games developer  . Smokeless tobacco: Never Used  Substance Use Topics  . Alcohol use: No    Frequency: Never    FAMILY HISTORY:  No family history on file.  DRUG ALLERGIES:   Allergies  Allergen Reactions  . Sulfa Antibiotics     REVIEW OF SYSTEMS:   Review of system unobtainable  MEDICATIONS AT HOME:   Prior to Admission medications   Medication Sig Start Date End Date Taking? Authorizing Provider  aspirin EC 81 MG tablet Take 81 mg by mouth daily.   Yes [provider]  carvedilol (COREG) 3.125 MG tablet Take 1 tablet by mouth 2 (two) times daily. 04/22/18  Yes [provider]   cetirizine (ZYRTEC) 10 MG tablet Take 1 tablet by mouth daily. 04/22/18  Yes [provider]  citalopram (CELEXA) 20 MG tablet Take 1 tablet by mouth 2 (two) times daily. 03/31/18  Yes [provider]  donepezil (ARICEPT) 10 MG tablet Take 1 tablet by mouth at bedtime. 04/22/18  Yes [provider]  ELIQUIS 5 MG TABS tablet Take 1 tablet by mouth 2 (two) times daily. 03/11/18  Yes [provider]  ezetimibe (ZETIA) 10 MG tablet Take 1 tablet by mouth daily. 04/22/18  Yes [provider]  levothyroxine (SYNTHROID, LEVOTHROID) 25 MCG tablet Take 1 tablet by mouth daily. 04/22/18  Yes [provider]  memantine (NAMENDA) 10 MG tablet Take 1 tablet by mouth 2 (two) times daily. 04/22/18  Yes [provider]  risperiDONE (RISPERDAL) 2 MG tablet Take 1 tablet by mouth daily. 04/22/18  Yes [provider]  albuterol (PROVENTIL HFA;VENTOLIN HFA) 108 (90 Base) MCG/ACT inhaler Inhale 2 puffs into the lungs every 6 (six) hours as needed for wheezing or shortness of breath. 01/31/18   Merrily Brittle, MD  cephALEXin (KEFLEX) 500 MG capsule Take 1 capsule (500 mg total) by mouth 2 (two) times daily. Patient not taking: Reported on 05/14/2018 01/31/18   Sharman Cheek, MD      VITAL SIGNS:  Blood pressure 131/88, pulse 94, temperature 98.1 F (36.7 C), temperature source Oral, resp. rate 18, height  5\' 8"  (1.727 m), weight 77.1 kg (170 lb), SpO2 100 %.  PHYSICAL EXAMINATION:  GENERAL:  84 y.o.-year-old patient lying in the bed with no acute distress.  EYES: Pupils equal, round, reactive to light and accommodation. No scleral icterus. Extraocular muscles intact.  HEENT: Head atraumatic, normocephalic. Oropharynx and nasopharynx clear.  NECK:  Supple, no jugular venous distention. No thyroid enlargement, no tenderness.  LUNGS: Normal breath sounds bilaterally, no wheezing, rales,rhonchi or crepitation. No use of accessory muscles of respiration.   CARDIOVASCULAR: S1, S2 normal. No murmurs, rubs, or gallops.  ABDOMEN: Soft, nontender, nondistended. Bowel sounds present. No organomegaly or mass.  EXTREMITIES: No pedal edema, cyanosis, or clubbing.  NEUROLOGIC: Delirious and disoriented.  PSYCHIATRIC: The patient is delerious SKIN: No obvious rash, lesion, or ulcer.   LABORATORY PANEL:   CBC Recent Labs  Lab 05/14/18 0949  WBC 14.8*  HGB 14.1  HCT 43.0  PLT 352   ------------------------------------------------------------------------------------------------------------------  Chemistries  Recent Labs  Lab 05/14/18 1135  NA 154*  K 3.2*  CL 119*  CO2 26  GLUCOSE 110*  BUN 59*  CREATININE 1.03*  CALCIUM 8.8*  AST 41  ALT 66*  ALKPHOS 65  BILITOT 2.6*   ------------------------------------------------------------------------------------------------------------------  Cardiac Enzymes Recent Labs  Lab 05/14/18 1135  TROPONINI 0.06*   ------------------------------------------------------------------------------------------------------------------  RADIOLOGY:  Ct Head Wo Contrast  Result Date: 05/14/2018 CLINICAL DATA:  Altered mental status EXAM: CT HEAD WITHOUT CONTRAST TECHNIQUE: Contiguous axial images were obtained from the base of the skull through the vertex without intravenous contrast. COMPARISON:  None. FINDINGS: Brain: Atrophic and chronic white matter ischemic changes are noted. No findings to suggest acute hemorrhage, acute infarction or space-occupying mass lesion are seen. Vascular: No hyperdense vessel or unexpected calcification. Skull: Normal. Negative for fracture or focal lesion. Sinuses/Orbits: No acute finding. Other: None. IMPRESSION: Chronic atrophic and ischemic changes without acute abnormality. Electronically Signed   By: Alcide CleverMark  Lukens M.D.   On: 05/14/2018 10:06   Dg Chest Port 1 View  Result Date: 05/14/2018 CLINICAL DATA:  84 year old female with altered mental status. Back pain.  EXAM: PORTABLE CHEST 1 VIEW COMPARISON:  Chest CTA 01/31/2018 and earlier. FINDINGS: Portable AP upright view at 1003 hours. Stable tortuosity of the thoracic aorta. Other mediastinal contours are within normal limits. Stable tracheal air column. Large lung volumes. Allowing for portable technique the lungs are clear. No pneumothorax or pleural effusion. Stable cholecystectomy clips. Left neck surgical clips. Osteopenia. No acute osseous abnormality identified. IMPRESSION: No acute cardiopulmonary abnormality. Electronically Signed   By: Odessa FlemingH  Hall M.D.   On: 05/14/2018 10:23    EKG:   Orders placed or performed during the hospital encounter of 05/14/18  . EKG 12-Lead  . EKG 12-Lead  . EKG 12-Lead  . EKG 12-Lead    IMPRESSION AND PLAN:   Tonette Ledererherese Rylander  is a 84 y.o. female with a known history of dementia from traumatic brain injury, anxiety, hypothyroidism is brought into the ED by her sons for generalized weakness which has been getting worse and also also for altered mental status.  Son is reporting that patient has been having some choking spells lately.  CT head with chronic changes, sodium is at 154 troponin is elevated 0.06 and potassium at 3.2.  #Acute metabolic encephalogram baseline dementia secondary to  electrolyte abnormalities Admit to MedSurg unit Fluid hydration with IV fluids and monitor electrolytes closely Neurochecks  #Hypernatremia and hypokalemia Normal saline 1 L bolus was given in the emergency department Half-normal  saline with 20 K  #Elevated troponin Could be from demand ischemia EKG with tachycardia from dehydration Cycle cardiac biomarkers  #Leukocytosis with normal lactic acid and normal procalcitonin Chest x-ray is negative and urinalysis is negative continue close monitoring  #Hypothyroidism we will resume Synthyroid once patient passes bedside swallow evaluation  #Hyperlipidemia currently patient is n.p.o. because of the choking episodes will resume  medications once patient passes bedside swallow eval  # h/o LL DVT - eliquis - hold for choking spells  #Intermittent episodes of dysphagia-bedside swallow evaluation pending currently n.p.o.  #History of traumatic brain injury patient is a total care   All the records are reviewed and case discussed with ED provider. Management plans discussed with the patient, family and they are in agreement.  CODE STATUS: DNR   TOTAL TIME TAKING CARE OF THIS PATIENT: 42  minutes.   Note: This dictation was prepared with Dragon dictation along with smaller phrase technology. Any transcriptional errors that result from this process are unintentional.  Ramonita Lab M.D on 05/14/2018 at 2:37 PM  Between 7am to 6pm - Pager - [phone]  After 6pm go to [email or web address] - password [email or web address]  [facility]  Office  [phone]  CC: Primary care physician; System, Pcp Not In

## 2018-05-14 NOTE — ED Triage Notes (Signed)
Pt from home/change in mental status/lower back pain noted/ pt with hx of dementia noted/hard of hearing/pt lives at home with family/pt uses briefs/incontinent/skin warm and dry./resp even and nonlabored/ urine noted to be foul smelling
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
## 2018-05-14 NOTE — ED Notes (Signed)
Report given to Erica RN

## 2018-05-14 NOTE — ED Provider Notes (Addendum)
[facility] Emergency Department Provider Note  ____________________________________________  Time seen: Approximately 11:14 AM  I have reviewed the triage vital signs and the nursing notes.   HISTORY  Chief Complaint Altered Mental Status and Back Pain  Level 5 Caveat: Portions of the History and Physical including HPI and review of systems are unable to be completely obtained due to patient being a poor historian related to chronic dementia and altered mental status   HPI Deanna Mills is a 84 y.o. female with a history of hyperlipidemia and dementia is brought to the ED due to increased confusion, generalized weakness for the past 3 days.  She lives at home with her family.   No recent falls.  Decreased oral intake.  No known vomiting diarrhea or pain complaints   Past Medical History:  Diagnosis Date  . Anxiety   . Dementia   . Hyperlipidemia   . Seasonal allergies   . TBI (traumatic brain injury) (HCC)   . Thyroid disease      Patient Active Problem List   Diagnosis Date Noted  . Altered mental status 05/14/2018     Past Surgical History:  Procedure Laterality Date  . ABDOMINAL HYSTERECTOMY       Prior to Admission medications   Medication Sig Start Date End Date Taking? Authorizing Provider  aspirin EC 81 MG tablet Take 81 mg by mouth daily.   Yes [provider]  carvedilol (COREG) 3.125 MG tablet Take 1 tablet by mouth 2 (two) times daily. 04/22/18  Yes [provider]  cetirizine (ZYRTEC) 10 MG tablet Take 1 tablet by mouth daily. 04/22/18  Yes [provider]  citalopram (CELEXA) 20 MG tablet Take 1 tablet by mouth 2 (two) times daily. 03/31/18  Yes [provider]  donepezil (ARICEPT) 10 MG tablet Take 1 tablet by mouth at bedtime. 04/22/18  Yes [provider]  ELIQUIS 5 MG TABS tablet Take 1 tablet by mouth 2 (two) times daily. 03/11/18  Yes [provider]  ezetimibe (ZETIA) 10  MG tablet Take 1 tablet by mouth daily. 04/22/18  Yes [provider]  levothyroxine (SYNTHROID, LEVOTHROID) 25 MCG tablet Take 1 tablet by mouth daily. 04/22/18  Yes [provider]  memantine (NAMENDA) 10 MG tablet Take 1 tablet by mouth 2 (two) times daily. 04/22/18  Yes [provider]  risperiDONE (RISPERDAL) 2 MG tablet Take 1 tablet by mouth daily. 04/22/18  Yes [provider]  albuterol (PROVENTIL HFA;VENTOLIN HFA) 108 (90 Base) MCG/ACT inhaler Inhale 2 puffs into the lungs every 6 (six) hours as needed for wheezing or shortness of breath. 01/31/18   Merrily Brittleifenbark, Neil, MD  cephALEXin (KEFLEX) 500 MG capsule Take 1 capsule (500 mg total) by mouth 2 (two) times daily. Patient not taking: Reported on 05/14/2018 01/31/18   Sharman CheekStafford, Harshaan Whang, MD     Allergies Sulfa antibiotics   No family history on file.  Social History Social History   Tobacco Use  . Smoking status: Former Games developermoker  . Smokeless tobacco: Never Used  Substance Use Topics  . Alcohol use: No    Frequency: Never  . Drug use: Not on file    Review of Systems Unable to reliably obtain due to altered mental status. ____________________________________________   PHYSICAL EXAM:  VITAL SIGNS: ED Triage Vitals  Enc Vitals Group     BP 05/14/18 0942 131/86     Pulse Rate 05/14/18 0942 94     Resp --  Temp 05/14/18 0942 (!) 97.5 F (36.4 C)     Temp Source 05/14/18 0942 Oral     SpO2 05/14/18 0942 95 %     Weight 05/14/18 0943 170 lb (77.1 kg)     Height 05/14/18 0943 5\' 8"  (1.727 m)     Head Circumference --      Peak Flow --      Pain Score --      Pain Loc --      Pain Edu? --      Excl. in GC? --     Vital signs reviewed, nursing assessments reviewed.   Constitutional: Awake, not alert, not oriented.  Ill-appearing. Eyes:   Conjunctivae are normal. EOMI. PERRL. ENT      Head:   Normocephalic and atraumatic.      Nose:   No congestion/rhinnorhea.       Mouth/Throat:    Dry mucous membranes, no pharyngeal erythema. No peritonsillar mass.       Neck:   No meningismus. Full ROM. Hematological/Lymphatic/Immunilogical:   No cervical lymphadenopathy. Cardiovascular:   Tachycardia heart rate 100. Symmetric bilateral radial and DP pulses.  No murmurs.  Respiratory:   Normal respiratory effort without tachypnea/retractions. Breath sounds are clear and equal bilaterally. No wheezes/rales/rhonchi. Gastrointestinal:   Soft and nontender. Non distended. There is no CVA tenderness.  No rebound, rigidity, or guarding.  Musculoskeletal:   Normal range of motion in all extremities. No joint effusions.  No lower extremity tenderness.  No edema. Neurologic:   Normal speech, very limited language range.  Motor grossly intact. No acute focal neurologic deficits are appreciated.  Skin:    Skin is warm, dry and intact. No rash noted.  No petechiae, purpura, or bullae.  ____________________________________________    LABS (pertinent positives/negatives) (all labs ordered are listed, but only abnormal results are displayed) Labs Reviewed  CBC WITH DIFFERENTIAL/PLATELET - Abnormal; Notable for the following components:      Result Value   WBC 14.8 (*)    RDW 15.8 (*)    Neutro Abs 11.1 (*)    Monocytes Absolute 1.9 (*)    All other components within normal limits  URINALYSIS, COMPLETE (UACMP) WITH MICROSCOPIC - Abnormal; Notable for the following components:   Color, Urine AMBER (*)    APPearance CLEAR (*)    Hgb urine dipstick SMALL (*)    Bacteria, UA MANY (*)    All other components within normal limits  PROTIME-INR - Abnormal; Notable for the following components:   Prothrombin Time 23.6 (*)    All other components within normal limits  COMPREHENSIVE METABOLIC PANEL - Abnormal; Notable for the following components:   Sodium 154 (*)    Potassium 3.2 (*)    Chloride 119 (*)    Glucose, Bld 110 (*)    BUN 59 (*)    Creatinine, Ser 1.03 (*)    Calcium 8.8 (*)     Albumin 3.3 (*)    ALT 66 (*)    Total Bilirubin 2.6 (*)    GFR calc non Af Amer 48 (*)    GFR calc Af Amer 55 (*)    All other components within normal limits  TROPONIN I - Abnormal; Notable for the following components:   Troponin I 0.06 (*)    All other components within normal limits  CULTURE, BLOOD (ROUTINE X 2)  CULTURE, BLOOD (ROUTINE X 2)  URINE CULTURE  LACTIC ACID, PLASMA  APTT  LIPASE, BLOOD  LACTIC ACID, PLASMA  PROCALCITONIN  INFLUENZA PANEL BY PCR (TYPE A & B)   ____________________________________________   EKG Interpreted by me Sinus rhythm rate of 92, left axis, normal intervals.  Normal QRS ST segments and T waves.   ____________________________________________    RADIOLOGY  Ct Head Wo Contrast  Result Date: 05/14/2018 CLINICAL DATA:  Altered mental status EXAM: CT HEAD WITHOUT CONTRAST TECHNIQUE: Contiguous axial images were obtained from the base of the skull through the vertex without intravenous contrast. COMPARISON:  None. FINDINGS: Brain: Atrophic and chronic white matter ischemic changes are noted. No findings to suggest acute hemorrhage, acute infarction or space-occupying mass lesion are seen. Vascular: No hyperdense vessel or unexpected calcification. Skull: Normal. Negative for fracture or focal lesion. Sinuses/Orbits: No acute finding. Other: None. IMPRESSION: Chronic atrophic and ischemic changes without acute abnormality. Electronically Signed   By: Alcide Clever M.D.   On: 05/14/2018 10:06   Dg Chest Port 1 View  Result Date: 05/14/2018 CLINICAL DATA:  84 year old female with altered mental status. Back pain. EXAM: PORTABLE CHEST 1 VIEW COMPARISON:  Chest CTA 01/31/2018 and earlier. FINDINGS: Portable AP upright view at 1003 hours. Stable tortuosity of the thoracic aorta. Other mediastinal contours are within normal limits. Stable tracheal air column. Large lung volumes. Allowing for portable technique the lungs are clear. No pneumothorax or  pleural effusion. Stable cholecystectomy clips. Left neck surgical clips. Osteopenia. No acute osseous abnormality identified. IMPRESSION: No acute cardiopulmonary abnormality. Electronically Signed   By: Odessa Fleming M.D.   On: 05/14/2018 10:23    ____________________________________________   PROCEDURES Procedures  ____________________________________________  DIFFERENTIAL DIAGNOSIS   Urinary tract infection, delirium, pneumonia, low suspicion meningitis or soft tissue infection.  CLINICAL IMPRESSION / ASSESSMENT AND PLAN / ED COURSE  Pertinent labs & imaging results that were available during my care of the patient were reviewed by me and considered in my medical decision making (see chart for details).    Patient presents with altered mental status, ill-appearing, suspected urinary tract infection.  Not a code sepsis, but sepsis work-up was initiated for screening of severity of illness..  IV fluids for hydration pending work-up.  Clinical Course as of May 14 1232  Fri May 14, 2018  1101 UA c/w UTI causing acute metabolic encephalopathy on top of chronic dementia. I will give IV ceftriaxone 1 gram   [PS]  1102 Cxr and CT head unremarkable.    [PS]  1112 Discussed with family who take care of the patient at home.  She is currently requiring 3 people to lift her and help her move around.  She is not able to participate in her ADLs.  With this acute decline in functional status, she will need to be admitted to the hospital for treatment of her UTI and encephalopathy until she regains strength and function.   [PS]  1231 Hypernatremia, elevated troponin on chem labs. Continue IVF.   Sodium(!): 154 [PS]    Clinical Course User Index [PS] Sharman Cheek, MD     ____________________________________________   FINAL CLINICAL IMPRESSION(S) / ED DIAGNOSES    Final diagnoses:  Lower urinary tract infectious disease  Disorientation  Metabolic encephalopathy  Hypernatremia      ED Discharge Orders    None      Portions of this note were generated with dragon dictation software. Dictation errors may occur despite best attempts at proofreading.    Sharman Cheek, MD 05/14/18 1118    Sharman Cheek, MD 05/14/18 1233

## 2018-05-14 NOTE — Care Management Obs Status (Signed)
MEDICARE OBSERVATION STATUS NOTIFICATION   Patient Details  Name: Deanna Mills MRN: [id number] Date of Birth: 1930/02/11   Medicare Observation Status Notification Given:  Yes  Explained to both sons Jerolyn ShinLeroy and Ocie Bobon Viar; patient has dementia  Collie Siadngela Anjolina Byrer, RN 05/14/2018, 12:22 PM

## 2018-05-14 NOTE — Plan of Care (Signed)
  Problem: Clinical Measurements: Goal: Diagnostic test results will improve Outcome: Progressing   Problem: Pain Managment: Goal: General experience of comfort will improve Outcome: Progressing   

## 2018-05-14 NOTE — Progress Notes (Addendum)
Unable to complete swallow screen, pt not cooperative and not wanting to open mouth even to do oral care. Dr. Amado CoeGouru also made aware of Na+ 156. No additional orders received. Will continue to monitor.

## 2018-05-14 NOTE — ED Notes (Signed)
Unable to give report at this time to floor

## 2018-05-15 DIAGNOSIS — L8990 Pressure ulcer of unspecified site, unspecified stage: Secondary | ICD-10-CM

## 2018-05-15 LAB — BASIC METABOLIC PANEL
Anion gap: 9 (ref 5–15)
BUN: 44 mg/dL — AB (ref 6–20)
CHLORIDE: 121 mmol/L — AB (ref 101–111)
CO2: 26 mmol/L (ref 22–32)
Calcium: 8.7 mg/dL — ABNORMAL LOW (ref 8.9–10.3)
Creatinine, Ser: 0.87 mg/dL (ref 0.44–1.00)
GFR calc Af Amer: >60 mL/min (ref >60)
GFR calc non Af Amer: 59 mL/min — ABNORMAL LOW (ref >60)
GLUCOSE: 85 mg/dL (ref 65–99)
POTASSIUM: 3.6 mmol/L (ref 3.5–5.1)
Sodium: 156 mmol/L — ABNORMAL HIGH (ref 135–145)

## 2018-05-15 LAB — BLOOD CULTURE ID PANEL (REFLEXED)
Acinetobacter baumannii: NOT DETECTED
CANDIDA GLABRATA: NOT DETECTED
CANDIDA KRUSEI: NOT DETECTED
CANDIDA TROPICALIS: NOT DETECTED
Candida albicans: NOT DETECTED
Candida parapsilosis: NOT DETECTED
ENTEROBACTER CLOACAE COMPLEX: NOT DETECTED
ESCHERICHIA COLI: NOT DETECTED
Enterobacteriaceae species: NOT DETECTED
Enterococcus species: NOT DETECTED
Haemophilus influenzae: NOT DETECTED
KLEBSIELLA OXYTOCA: NOT DETECTED
KLEBSIELLA PNEUMONIAE: NOT DETECTED
Listeria monocytogenes: NOT DETECTED
Methicillin resistance: DETECTED — AB
Neisseria meningitidis: NOT DETECTED
Proteus species: NOT DETECTED
Pseudomonas aeruginosa: NOT DETECTED
STREPTOCOCCUS AGALACTIAE: NOT DETECTED
Serratia marcescens: NOT DETECTED
Staphylococcus aureus (BCID): NOT DETECTED
Staphylococcus species: DETECTED — AB
Streptococcus pneumoniae: NOT DETECTED
Streptococcus pyogenes: NOT DETECTED
Streptococcus species: NOT DETECTED

## 2018-05-15 LAB — OSMOLALITY, URINE: OSMOLALITY UR: 855 mosm/kg (ref 300–900)

## 2018-05-15 LAB — TROPONIN I: TROPONIN I: 0.05 ng/mL — AB (ref <0.03)

## 2018-05-15 MED ORDER — DONEPEZIL HCL 5 MG PO TABS
10.0000 mg | ORAL_TABLET | Freq: Every day | ORAL | Status: DC
  Administered 2018-05-15 – 2018-05-16 (×2): 10 mg via ORAL
  Filled 2018-05-15 (×3): qty 2

## 2018-05-15 MED ORDER — RISPERIDONE 1 MG PO TABS
2.0000 mg | ORAL_TABLET | Freq: Every day | ORAL | Status: DC
  Administered 2018-05-15 – 2018-05-16 (×2): 2 mg via ORAL
  Filled 2018-05-15 (×3): qty 2

## 2018-05-15 MED ORDER — MEMANTINE HCL 5 MG PO TABS
10.0000 mg | ORAL_TABLET | Freq: Two times a day (BID) | ORAL | Status: DC
  Administered 2018-05-15 – 2018-05-16 (×4): 10 mg via ORAL
  Filled 2018-05-15 (×4): qty 2

## 2018-05-15 MED ORDER — SODIUM CHLORIDE 0.9 % IV SOLN
1.0000 g | INTRAVENOUS | Status: DC
  Administered 2018-05-15 – 2018-05-16 (×2): 1 g via INTRAVENOUS
  Filled 2018-05-15: qty 10
  Filled 2018-05-15 (×2): qty 1
  Filled 2018-05-15: qty 10

## 2018-05-15 MED ORDER — CITALOPRAM HYDROBROMIDE 20 MG PO TABS
20.0000 mg | ORAL_TABLET | Freq: Two times a day (BID) | ORAL | Status: DC
  Administered 2018-05-15 – 2018-05-16 (×4): 20 mg via ORAL
  Filled 2018-05-15 (×4): qty 1

## 2018-05-15 MED ORDER — CIPROFLOXACIN IN D5W 400 MG/200ML IV SOLN
400.0000 mg | Freq: Two times a day (BID) | INTRAVENOUS | Status: DC
  Administered 2018-05-15: 05:00:00 400 mg via INTRAVENOUS
  Filled 2018-05-15 (×2): qty 200

## 2018-05-15 MED ORDER — DEXTROSE 5 % IV SOLN
INTRAVENOUS | Status: AC
  Administered 2018-05-15 – 2018-05-16 (×3): via INTRAVENOUS

## 2018-05-15 MED ORDER — EZETIMIBE 10 MG PO TABS
10.0000 mg | ORAL_TABLET | Freq: Every day | ORAL | Status: DC
  Administered 2018-05-15 – 2018-05-16 (×2): 10 mg via ORAL
  Filled 2018-05-15 (×3): qty 1

## 2018-05-15 MED ORDER — ASPIRIN EC 81 MG PO TBEC
81.0000 mg | DELAYED_RELEASE_TABLET | Freq: Every day | ORAL | Status: DC
  Administered 2018-05-15 – 2018-05-16 (×2): 81 mg via ORAL
  Filled 2018-05-15 (×2): qty 1

## 2018-05-15 MED ORDER — APIXABAN 5 MG PO TABS
5.0000 mg | ORAL_TABLET | Freq: Two times a day (BID) | ORAL | Status: DC
  Administered 2018-05-15 – 2018-05-16 (×3): 5 mg via ORAL
  Filled 2018-05-15 (×3): qty 1

## 2018-05-15 MED ORDER — VANCOMYCIN HCL IN DEXTROSE 1-5 GM/200ML-% IV SOLN
1000.0000 mg | Freq: Once | INTRAVENOUS | Status: AC
  Administered 2018-05-15: 05:00:00 1000 mg via INTRAVENOUS
  Filled 2018-05-15: qty 200

## 2018-05-15 MED ORDER — LEVOTHYROXINE SODIUM 25 MCG PO TABS
25.0000 ug | ORAL_TABLET | Freq: Every day | ORAL | Status: DC
  Administered 2018-05-15 – 2018-05-16 (×2): 25 ug via ORAL
  Filled 2018-05-15 (×2): qty 1

## 2018-05-15 MED ORDER — VANCOMYCIN HCL IN DEXTROSE 1-5 GM/200ML-% IV SOLN
1000.0000 mg | INTRAVENOUS | Status: DC
  Filled 2018-05-15: qty 200

## 2018-05-15 NOTE — Evaluation (Signed)
Clinical/Bedside Swallow Evaluation Patient Details  Name: Deanna Mills MRN: [id number] Date of Birth: 12-11-1929  Today's Date: 05/15/2018 Time: SLP Start Time (ACUTE ONLY): 1028 SLP Stop Time (ACUTE ONLY): 1057 SLP Time Calculation (min) (ACUTE ONLY): 29 min  Past Medical History:  Past Medical History:  Diagnosis Date  . Anxiety   . Dementia   . Hyperlipidemia   . Seasonal allergies   . TBI (traumatic brain injury) (HCC)   . Thyroid disease    Past Surgical History:  Past Surgical History:  Procedure Laterality Date  . ABDOMINAL HYSTERECTOMY     HPI:      Assessment / Plan / Recommendation Clinical Impression  pt presents with an oral pharyngeal dysphagia as characterized by coughing with thin liquids iva straw and poor intake and mastication with regular solids. pt was noted to hold solids and have an increased a-p transit time. pt was appearing weak durin gevaluation and slow processing was noted. pt was not offered regular solids due to poor intake and poor processing speed with regular textures.  SLP recommends dys 2 with thin liquids via cup no straws to reduce risk of aspiration.  SLP Visit Diagnosis: Dysphagia, oropharyngeal phase (R13.12)    Aspiration Risk  Moderate aspiration risk    Diet Recommendation Dysphagia 2 (Fine chop);Thin liquid   Liquid Administration via: No straw Medication Administration: Crushed with puree Supervision: Staff to assist with self feeding Compensations: Slow rate;Small sips/bites;Follow solids with liquid Postural Changes: Seated upright at 90 degrees    Other  Recommendations Oral Care Recommendations: Oral care BID   Follow up Recommendations        Frequency and Duration min 3x week  2 weeks       Prognosis Prognosis for Safe Diet Advancement: Good Barriers to Reach Goals: Cognitive deficits      Swallow Study   General Date of Onset: 05/15/18 Type of Study: Bedside Swallow Evaluation Diet Prior to this Study:  Regular;Thin liquids Temperature Spikes Noted: No Respiratory Status: Nasal cannula History of Recent Intubation: No Behavior/Cognition: Cooperative;Pleasant mood;Confused Oral Cavity Assessment: Within Functional Limits Oral Care Completed by SLP: No Oral Cavity - Dentition: Adequate natural dentition Vision: Functional for self-feeding Self-Feeding Abilities: Able to feed self Patient Positioning: Upright in bed Baseline Vocal Quality: Breathy Volitional Cough: Weak Volitional Swallow: Able to elicit    Oral/Motor/Sensory Function Overall Oral Motor/Sensory Function: Within functional limits   Ice Chips Ice chips: Within functional limits Presentation: Spoon   Thin Liquid Thin Liquid: Within functional limits Presentation: Cup Other Comments: pt had cough with straws.     Nectar Thick Nectar Thick Liquid: Not tested   Honey Thick Honey Thick Liquid: Not tested   Puree Puree: Within functional limits Presentation: Spoon   Solid   GO   Solid: Within functional limits Presentation: Spoon Other Comments: pt was not offered regular solids due to poor oral control and masticaiton        FirstEnergy CorpStacie Harris Sauber 05/15/2018,11:05 AM
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
## 2018-05-15 NOTE — Progress Notes (Signed)
PT Cancellation Note  Patient Details Name: Deanna Mills MRN: [id number] DOB: 1930/05/25   Cancelled Treatment:    Reason Eval/Treat Not Completed: Other (comment).  Pt was attempted and not only has not eaten lunch yet, refused over pain.  Notified nursing and will follow up when time and pt allow.   Ivar DrapeRuth E Jezebel Pollet 05/15/2018, 2:12 PM   Samul Dadauth Niti Leisure, PT MS Acute Rehab Dept. Number: [facility] [phone] and [facility] [phone]

## 2018-05-15 NOTE — Progress Notes (Signed)
Patient has been minimally responsive all shift.  Will moan or open eyes to painful stimuli.  Otherwise nonverbal. Incontinent of B&B.  External catheter in place.  Only about 150 cc of dark tea colored urinary output this shift.  Blaccer scan showed another 150. 1/2 NS c 20 of K going in at 100.  Skin color has become more sallow towards the end of the shift.  Notified Dr. Sheryle Hailiamond of all of these findings.

## 2018-05-15 NOTE — Progress Notes (Signed)
[facility] - [place] at [facility]   PATIENT NAME: Deanna Mills    MR#:  [id number]  DATE OF BIRTH:  [birth year]  SUBJECTIVE:   Patient admitted to the hospital secondary to altered mental status and back pain and noted to have a urinary tract infection and also noted to be hypernatremic  REVIEW OF SYSTEMS:    Review of Systems  Unable to perform ROS: Dementia    Nutrition: Dysphagia II with thin liquids Tolerating Diet: yes Tolerating PT:  Await Eval.   DRUG ALLERGIES:   Allergies  Allergen Reactions  . Sulfa Antibiotics     VITALS:  Blood pressure (!) 141/81, pulse 79, temperature 97.6 F (36.4 C), temperature source Oral, resp. rate 16, height 5\' 8"  (1.727 m), weight 77.1 kg (170 lb), SpO2 99 %.  PHYSICAL EXAMINATION:   Physical Exam  GENERAL:  84 y.o.-year-old patient lying in bed in no acute distress.  EYES: Pupils equal, round, reactive to light and accommodation. No scleral icterus. Extraocular muscles intact.  HEENT: Head atraumatic, normocephalic. Dry Oral Mucosa. NECK:  Supple, no jugular venous distention. No thyroid enlargement, no tenderness.  LUNGS: Normal breath sounds bilaterally, no wheezing, rales, rhonchi. No use of accessory muscles of respiration.  CARDIOVASCULAR: S1, S2 normal. No murmurs, rubs, or gallops.  ABDOMEN: Soft, nontender, nondistended. Bowel sounds present. No organomegaly or mass.  EXTREMITIES: No cyanosis, clubbing or edema b/l.    NEUROLOGIC: Cranial nerves II through XII are intact. No focal Motor or sensory deficits b/l.  Globally weak. PSYCHIATRIC: The patient is alert and oriented x 1.  SKIN: No obvious rash, lesion, or ulcer.    LABORATORY PANEL:   CBC Recent Labs  Lab 05/14/18 0949  WBC 14.8*  HGB 14.1  HCT 43.0  PLT 352   ------------------------------------------------------------------------------------------------------------------  Chemistries  Recent Labs  Lab 05/14/18 1135   05/15/18 0147  NA 154*   < > 156*  K 3.2*  --  3.6  CL 119*  --  121*  CO2 26  --  26  GLUCOSE 110*  --  85  BUN 59*  --  44*  CREATININE 1.03*  --  0.87  CALCIUM 8.8*  --  8.7*  AST 41  --   --   ALT 66*  --   --   ALKPHOS 65  --   --   BILITOT 2.6*  --   --    < > = values in this interval not displayed.   ------------------------------------------------------------------------------------------------------------------  Cardiac Enzymes Recent Labs  Lab 05/15/18 0147  TROPONINI 0.05*   ------------------------------------------------------------------------------------------------------------------  RADIOLOGY:  Ct Head Wo Contrast  Result Date: 05/14/2018 CLINICAL DATA:  Altered mental status EXAM: CT HEAD WITHOUT CONTRAST TECHNIQUE: Contiguous axial images were obtained from the base of the skull through the vertex without intravenous contrast. COMPARISON:  None. FINDINGS: Brain: Atrophic and chronic white matter ischemic changes are noted. No findings to suggest acute hemorrhage, acute infarction or space-occupying mass lesion are seen. Vascular: No hyperdense vessel or unexpected calcification. Skull: Normal. Negative for fracture or focal lesion. Sinuses/Orbits: No acute finding. Other: None. IMPRESSION: Chronic atrophic and ischemic changes without acute abnormality. Electronically Signed   By: Alcide CleverMark  Lukens M.D.   On: 05/14/2018 10:06   Dg Chest Port 1 View  Result Date: 05/14/2018 CLINICAL DATA:  84 year old female with altered mental status. Back pain. EXAM: PORTABLE CHEST 1 VIEW COMPARISON:  Chest CTA 01/31/2018 and earlier. FINDINGS: Portable AP upright view at 1003 hours. Stable  tortuosity of the thoracic aorta. Other mediastinal contours are within normal limits. Stable tracheal air column. Large lung volumes. Allowing for portable technique the lungs are clear. No pneumothorax or pleural effusion. Stable cholecystectomy clips. Left neck surgical clips. Osteopenia. No  acute osseous abnormality identified. IMPRESSION: No acute cardiopulmonary abnormality. Electronically Signed   By: Odessa Fleming M.D.   On: 05/14/2018 10:23     ASSESSMENT AND PLAN:   84 year old female with past medical history of advanced Alzheimer's dementia, hypothyroidism, anxiety, hyperlipidemia, previous history of traumatic brain injury who presents to the hospital due to back pain, altered mental status and noted to have UTI with hypernatremia.   1.  Hypernatremia- due to dehydration from poor p.o. intake and dementia. - We will switch from half-normal saline to D5W.  Follow sodium.  2.  Urinary tract infection- we will treat the patient with IV ceftriaxone, follow urine cultures.  Currently positive for gram-negative rod but has not been identified yet.  3.  Elevated troponin-secondary to supply demand ischemia from dehydration. -We will DC telemetry.  4.  Altered mental status-secondary to UTI and elect light abnormalities.  Follow-up after IV antibiotic therapy and correction of her electrolytes.  5.  Dementia-we will resume her Aricept, Namenda.  6.  Hypothyroidism-continue Synthroid.  7.  History of previous DVT- we will resume her Eliquis. D/c Lovenox     All the records are reviewed and case discussed with Care Management/Social Worker. Management plans discussed with the patient, family and they are in agreement.  CODE STATUS: DNR  DVT Prophylaxis: Eliquis  TOTAL TIME TAKING CARE OF THIS PATIENT: 30 minutes.   POSSIBLE D/C IN 1-2 DAYS, DEPENDING ON CLINICAL CONDITION.   Houston Siren M.D on 05/15/2018 at 12:35 PM  Between 7am to 6pm - Pager - [phone]  After 6pm go to [email or web address] - Social research officer, government  [facility] [place] [facility]  Office  [phone]  CC: Primary care physician; System, Pcp Not In

## 2018-05-15 NOTE — Progress Notes (Signed)
Pharmacy Antibiotic Note  Deanna Mills is a 84 y.o. female admitted on 05/14/2018 with + BCID fot Staph spp mec A(+).  Pharmacy has been consulted for vancomycin dosing.  Plan: DW 77kg  Vd 54L kei 0.047 hr-1  T1/2 15 hours Vancomycin 1 gram q 18 hours ordered with stacked dosing. Level before 5th dose. Goal trough 15-20.   Height: 5\' 8"  (172.7 cm) Weight: 170 lb (77.1 kg) IBW/kg (Calculated) : 63.9  Temp (24hrs), Avg:97.9 F (36.6 C), Min:97.5 F (36.4 C), Max:98.4 F (36.9 C)  Recent Labs  Lab 05/14/18 0949 05/14/18 1135 05/15/18 0147  WBC 14.8*  --   --   CREATININE  --  1.03* 0.87  LATICACIDVEN 1.7  --   --     Estimated Creatinine Clearance: 50.7 mL/min (by C-G formula based on SCr of 0.87 mg/dL).    Allergies  Allergen Reactions  . Sulfa Antibiotics     Antimicrobials this admission: Ceftriaxone 6/14  >> 6/15 cipro, vanc   >>   Dose adjustments this admission:   Microbiology results: 6/15 BCx: BCID 1/4 Staph spp mecA(+) 6/14 UCx: pending    Thank you for allowing pharmacy to be a part of this patient'Mills care.  Deanna Mills 05/15/2018 4:26 AM

## 2018-05-16 LAB — URINE CULTURE: Culture: >=100000 — AB

## 2018-05-16 LAB — BASIC METABOLIC PANEL
Anion gap: 6 (ref 5–15)
BUN: 34 mg/dL — AB (ref 6–20)
CHLORIDE: 114 mmol/L — AB (ref 101–111)
CO2: 26 mmol/L (ref 22–32)
Calcium: 8.3 mg/dL — ABNORMAL LOW (ref 8.9–10.3)
Creatinine, Ser: 0.85 mg/dL (ref 0.44–1.00)
GFR calc Af Amer: >60 mL/min (ref >60)
GFR calc non Af Amer: >60 mL/min (ref >60)
GLUCOSE: 117 mg/dL — AB (ref 65–99)
POTASSIUM: 3.6 mmol/L (ref 3.5–5.1)
SODIUM: 146 mmol/L — AB (ref 135–145)

## 2018-05-16 MED ORDER — ENSURE ENLIVE PO LIQD
237.0000 mL | Freq: Three times a day (TID) | ORAL | Status: DC
  Administered 2018-05-16: 14:00:00 237 mL via ORAL

## 2018-05-16 MED ORDER — OCUVITE-LUTEIN PO CAPS
1.0000 | ORAL_CAPSULE | Freq: Every day | ORAL | Status: DC
  Administered 2018-05-16: 22:00:00 1 via ORAL
  Filled 2018-05-16 (×2): qty 1

## 2018-05-16 MED ORDER — ADULT MULTIVITAMIN W/MINERALS CH: 1.0000 | ORAL_TABLET | Freq: Every day | ORAL | Status: DC

## 2018-05-16 NOTE — Clinical Social Work Note (Signed)
Clinical Social Work Assessment  Patient Details  Name: Deanna Mills MRN: [id number] Date of Birth: 03-Dec-1929  Date of referral:  05/16/18               Reason for consult:  Facility Placement                Permission sought to share information with:  Chartered certified accountant granted to share information::  Yes, Verbal Permission Granted  Name::        Agency::  [facility] area SNFs  Relationship::     Contact Information:     Housing/Transportation Living arrangements for the past 2 months:  [place] of Information:  Medical Team, Adult Children Patient Interpreter Needed:  None Criminal Activity/Legal Involvement Pertinent to Current Situation/Hospitalization:  No - Comment as needed Significant Relationships:  Adult Children, Other Family Members Lives with:  Adult Children Do you feel safe going back to the place where you live?  Yes Need for family participation in patient care:  Yes (Comment)(patient is somnolent at this time )  Care giving concerns:  PT recommendation for SNF/Concerns for neglect   Social Worker assessment / plan: The CSW met with the patient and her son, Deanna Mills, at bedside to discuss discharge planning. Don gave verbal permission to begin the referral process for SNF placement. The CSW provided a list of area SNFs and explained the process for referral.   The patient is a [place] resident who was displaced due to [facility]. She and her oldest son live together in [place]; however, during their displacement, they have been living with her youngest son, Deanna Mills. The plan was for them to return to [place] this past Tuesday, when the patient became ill. At baseline, the patient does shuffle and is able to stand with no assistance. The patient is incontinent of bladder and bowel and has a history of dementia. The patient does have pressure injuries; however, her family is involved in her care and do not seem neglectful.  Therefore, APS report is not mandated in this situation, especially as they are more than willing to follow PT recommendations.   The CSW has begun the PASRR process which will need manual review due [place]. The referral has been sent, and the CSW will provide bed offers when available. The patient will most likely discharge tomorrow or Tuesday which is within the qualifying stay guidelines for Medicare. CSW will continue to follow.  Employment status:  Retired Forensic scientist:  [employer] PT Recommendations:  [place] / Referral to community resources:  [place]  Patient/Family's Response to care:  The patient was not verbal, but she did seem to listen to the CSW. The patient's son was gracious and appropriate.  Patient/Family's Understanding of and Emotional Response to Diagnosis, Current Treatment, and Prognosis:  The patient's son is worried about his mother returning to [place] via car and is looking at the potential for flight. The family seems to understand that her needs are acute and that she would benefit from SNF.   Emotional Assessment Appearance:  Appears stated age Attitude/Demeanor/Rapport:  Lethargic, Unresponsive Affect (typically observed):  Stable Orientation:    Alcohol / Substance use:  Never Used Psych involvement (Current and /or in the community):  No (Comment)  Discharge Needs  Concerns to be addressed:  Care Coordination, Discharge Planning Concerns Readmission within the last 30 days:  No Current discharge risk:  Chronically ill Barriers to  Discharge:  Continued Medical Work up   [employer], LCSW 05/16/2018, 11:10 AM

## 2018-05-16 NOTE — Progress Notes (Signed)
[facility] - [place] at [facility]   PATIENT NAME: Deanna Mills    MR#:  [id number]  DATE OF BIRTH:  12/19/1929  SUBJECTIVE:   Sodium improved since yesterday.  Mental status is also improved.  Urine cultures positive for Citrobacter.  REVIEW OF SYSTEMS:    Review of Systems  Unable to perform ROS: Dementia    Nutrition: Dysphagia II with thin liquids Tolerating Diet: yes Tolerating PT:  Eval noted.   DRUG ALLERGIES:   Allergies  Allergen Reactions  . Sulfa Antibiotics     VITALS:  Blood pressure 103/65, pulse 66, temperature 97.7 F (36.5 C), resp. rate 16, height 5\' 8"  (1.727 m), weight 77.1 kg (170 lb), SpO2 99 %.  PHYSICAL EXAMINATION:   Physical Exam  GENERAL:  84 y.o.-year-old patient lying in bed in no acute distress.  EYES: Pupils equal, round, reactive to light and accommodation. No scleral icterus. Extraocular muscles intact.  HEENT: Head atraumatic, normocephalic. Moist Oral Mucosa.  NECK:  Supple, no jugular venous distention. No thyroid enlargement, no tenderness.  LUNGS: Normal breath sounds bilaterally, no wheezing, rales, rhonchi. No use of accessory muscles of respiration.  CARDIOVASCULAR: S1, S2 normal. No murmurs, rubs, or gallops.  ABDOMEN: Soft, nontender, nondistended. Bowel sounds present. No organomegaly or mass.  EXTREMITIES: No cyanosis, clubbing or edema b/l.    NEUROLOGIC: Cranial nerves II through XII are intact. No focal Motor or sensory deficits b/l.  Globally weak. PSYCHIATRIC: The patient is alert and oriented x 1.  SKIN: No obvious rash, lesion, or ulcer.    LABORATORY PANEL:   CBC Recent Labs  Lab 05/14/18 0949  WBC 14.8*  HGB 14.1  HCT 43.0  PLT 352   ------------------------------------------------------------------------------------------------------------------  Chemistries  Recent Labs  Lab 05/14/18 1135  05/16/18 0442  NA 154*   < > 146*  K 3.2*   < > 3.6  CL 119*   < > 114*  CO2 26   <  > 26  GLUCOSE 110*   < > 117*  BUN 59*   < > 34*  CREATININE 1.03*   < > 0.85  CALCIUM 8.8*   < > 8.3*  AST 41  --   --   ALT 66*  --   --   ALKPHOS 65  --   --   BILITOT 2.6*  --   --    < > = values in this interval not displayed.   ------------------------------------------------------------------------------------------------------------------  Cardiac Enzymes Recent Labs  Lab 05/15/18 0147  TROPONINI 0.05*   ------------------------------------------------------------------------------------------------------------------  RADIOLOGY:  No results found.   ASSESSMENT AND PLAN:   84 year old female with past medical history of advanced Alzheimer's dementia, hypothyroidism, anxiety, hyperlipidemia, previous history of traumatic brain injury who presents to the hospital due to back pain, altered mental status and noted to have UTI with hypernatremia.   1.  Hypernatremia- due to dehydration from poor p.o. intake and dementia. - Improved with D5W and will continue to monitor.  2.  Urinary tract infection- we will treat the patient with IV ceftriaxone -Urine cultures were positive for Citrobacter and it sensitive to ceftriaxone.  Will switch to oral Ceftin upon discharge.  3.  Elevated troponin-secondary to supply demand ischemia from dehydration. -We will DC telemetry.  4.  Altered mental status-secondary to UTI and electrolyte abnormalities.   - improving with fluids and abx.   5.  Dementia-we will resume her Aricept, Namenda.  6.  Hypothyroidism-continue Synthroid.  7.  History of previous  DVT- we will resume her Eliquis. D/c Lovenox  Seen by PT and likely will need SNF/STR and family in agreement.    All the records are reviewed and case discussed with Care Management/Social Worker. Management plans discussed with the patient, family and they are in agreement.  CODE STATUS: DNR  DVT Prophylaxis: Eliquis  TOTAL TIME TAKING CARE OF THIS PATIENT: 30 minutes.    POSSIBLE D/C IN 1-2 DAYS, DEPENDING ON CLINICAL CONDITION.   Houston Siren M.D on 05/16/2018 at 11:21 AM  Between 7am to 6pm - Pager - [phone]  After 6pm go to [email or web address] - Social research officer, government  [facility] [place] [facility]  Office  [phone]  CC: Primary care physician; System, Pcp Not In

## 2018-05-16 NOTE — Progress Notes (Signed)
Pt bathed.  Replaced foam dressings to heels and sacrum.  Pt with deep tissue injury to left buttock, stage 2 to mid line buttock, maceration, MASD to right buttock. Washed with soap and water and applied moisture barrier to areas then covered with new sacral foam.  Unstageable pressure injury to bilateral lateral heels and stage 1 pressure injury to right heel.  Washed with soap and water and applied heel foams and Pravalon boots. Ordered low air loss mattress from [employer] and RD consult for poor appetite and wound healing. Henriette CombsSarah Jeni Duling RN

## 2018-05-16 NOTE — Plan of Care (Signed)
  Problem: Education: Goal: Knowledge of General Education information will improve Outcome: Progressing   Problem: Health Behavior/Discharge Planning: Goal: Ability to manage health-related needs will improve Outcome: Progressing   Problem: Clinical Measurements: Goal: Ability to maintain clinical measurements within normal limits will improve Outcome: Progressing Goal: Will remain free from infection Outcome: Progressing Goal: Respiratory complications will improve Outcome: Progressing Goal: Cardiovascular complication will be avoided Outcome: Progressing   Problem: Pain Managment: Goal: General experience of comfort will improve Outcome: Progressing

## 2018-05-16 NOTE — Evaluation (Signed)
Physical Therapy Evaluation Patient Details Name: Deanna Mills MRN: [id number] DOB: 1930-07-05 Today's Date: 05/16/2018   History of Present Illness  Pt is an 84 y.o. female with a known history of dementia from traumatic brain injury, anxiety, and hypothyroidism was brought into the ED by her sons for generalized weakness which has been getting worse and also also for altered mental status.  CT head with chronic changes, sodium was at 154, troponin was elevated 0.06 and potassium at 3.2.  Hospitalist team was called to admit the patient.  Assessment includes: hypernatremia, UTI, elevated troponin secondary to demand ischemia, AMS secondary to UTI, and hypothyroidism.      Clinical Impression  Pt presents with deficits in strength, transfers, mobility, gait, balance, and activity tolerance.  Pt required +2 extensive assist for bed mobility and for transfers.  Pt transferred and ambulated with HHA limited community distances at baseline with no fall history but was unable to advance either LE while in standing during the session even with +2 HHA.  Pt presented with significant posterior instability in standing requiring constant assist to prevent LOB.  Pt with minor posterior instability in sitting that improved somewhat during the session.  Overall pt presents with a significant decline in functional mobility from her baseline and would be unsafe to return to her prior living situation at this time.  Pt will benefit from PT services in a SNF setting upon discharge to safely address above deficits for decreased caregiver assistance and eventual return to PLOF.      Follow Up Recommendations SNF;Supervision for mobility/OOB    Equipment Recommendations  None recommended by PT    Recommendations for Other Services       Precautions / Restrictions Precautions Precautions: Fall Restrictions Weight Bearing Restrictions: No      Mobility  Bed Mobility Overal bed mobility: Needs  Assistance Bed Mobility: Sit to Supine;Supine to Sit     Supine to sit: Max assist;+2 for physical assistance Sit to supine: Max assist;+2 for physical assistance   General bed mobility comments: Extensive verbal and tactile cues for sequencing with +2 max asssit for BLE and trunk in/out of bed  Transfers Overall transfer level: Needs assistance Equipment used: 2 person hand held assist Transfers: Sit to/from Stand Sit to Stand: +2 physical assistance;Mod assist;Max assist         General transfer comment: Pt required assistance with proper positioning pre-transfer and once in standing required constant assist to prevent posterior LOB  Ambulation/Gait             General Gait Details: Pt unable to advance either LE in standing  Stairs            Wheelchair Mobility    Modified Rankin (Stroke Patients Only)       Balance Overall balance assessment: Needs assistance Sitting-balance support: Bilateral upper extremity supported;Feet supported Sitting balance-Leahy Scale: Fair   Postural control: Posterior lean Standing balance support: Bilateral upper extremity supported Standing balance-Leahy Scale: Poor Standing balance comment: Posterior instability in standing requiring constant assist to prevent LOB                             Pertinent Vitals/Pain Pain Assessment: No/denies pain    Home Living Family/patient expects to be discharged to:: Private residence(History provided by son in room) Living Arrangements: Children Available Help at Discharge: Available 24 hours/day Type of Home: House Home Access: Stairs to enter Entrance Stairs-Rails: None Entrance  Stairs-Number of Steps: 3 Home Layout: Multi-level;Able to live on main level with bedroom/bathroom Home Equipment: Dan HumphreysWalker - 2 wheels;Wheelchair - manual      Prior Function Level of Independence: Needs assistance   Gait / Transfers Assistance Needed: Amb limited community distances  with HHA, min A with transfers, Ind with bed mobility, no fall history  ADL's / Homemaking Assistance Needed: Family assists with all ADLs        Hand Dominance        Extremity/Trunk Assessment   Upper Extremity Assessment Upper Extremity Assessment: Generalized weakness    Lower Extremity Assessment Lower Extremity Assessment: Generalized weakness       Communication      Cognition Arousal/Alertness: Lethargic Behavior During Therapy: Flat affect Overall Cognitive Status: Impaired/Different from baseline Area of Impairment: Orientation;Attention;Following commands;Awareness                 Orientation Level: Disoriented to;Place;Time;Situation     Following Commands: Follows one step commands inconsistently       General Comments: Son reports patient is somewhat more aware this date but is not close to her baseline where she was able to hold conversations      General Comments      Exercises Other Exercises Other Exercises: Sit to/from stand transfer training from various height surfaces Other Exercises: Anterior weight shifting activities in sitting and standing to address posterior instability   Assessment/Plan    PT Assessment Patient needs continued PT services  PT Problem List Decreased strength;Decreased activity tolerance;Decreased balance;Decreased knowledge of use of DME;Decreased mobility       PT Treatment Interventions DME instruction;Gait training;Stair training;Functional mobility training;Balance training;Therapeutic exercise;Therapeutic activities;Patient/family education    PT Goals (Current goals can be found in the Care Plan section)  Acute Rehab PT Goals PT Goal Formulation: Patient unable to participate in goal setting Time For Goal Achievement: 05/29/18 Potential to Achieve Goals: Fair    Frequency Min 2X/week   Barriers to discharge Inaccessible home environment      Co-evaluation               AM-PAC PT "6  Clicks" Daily Activity  Outcome Measure Difficulty turning over in bed (including adjusting bedclothes, sheets and blankets)?: Unable Difficulty moving from lying on back to sitting on the side of the bed? : Unable Difficulty sitting down on and standing up from a chair with arms (e.g., wheelchair, bedside commode, etc,.)?: Unable Help needed moving to and from a bed to chair (including a wheelchair)?: A Lot Help needed walking in hospital room?: Total Help needed climbing 3-5 steps with a railing? : Total 6 Click Score: 7    End of Session Equipment Utilized During Treatment: Gait belt Activity Tolerance: Patient tolerated treatment well Patient left: in bed;with call bell/phone within reach;with bed alarm set;with family/visitor present Nurse Communication: Mobility status PT Visit Diagnosis: Unsteadiness on feet (R26.81);Difficulty in walking, not elsewhere classified (R26.2);Muscle weakness (generalized) (M62.81)    Time: [id number] PT Time Calculation (min) (ACUTE ONLY): 35 min   Charges:   PT Evaluation $PT Eval Low Complexity: 1 Low PT Treatments $Therapeutic Activity: 8-22 mins   PT G Codes:        Elly Modena. Scott Dorine Duffey PT, DPT 05/16/18, 11:50 AM

## 2018-05-16 NOTE — Progress Notes (Signed)
Initial Nutrition Assessment  DOCUMENTATION CODES:   Non-severe (moderate) malnutrition in context of chronic illness  INTERVENTION:  Provide Ensure Enlive po TID, each supplement provides 350 kcal and 20 grams of protein.  Provide Magic cup TID with meals, each supplement provides 290 kcal and 9 grams of protein.  Provide daily MVI.  Provide Ocuvite daily for wound healing (provides zinc, vitamin A, vitamin C, Vitamin E, copper, and selenium).  Provide 1:1 assistance at meals.  NUTRITION DIAGNOSIS:   Moderate Malnutrition related to chronic illness(advanced dementia, hx TBI, multiple non-healing wounds) as evidenced by moderate fat depletion, moderate muscle depletion.  GOAL:   Patient will meet greater than or equal to 90% of their needs  MONITOR:   PO intake, Supplement acceptance, Labs, Weight trends, Skin, I & O's  REASON FOR ASSESSMENT:   Consult Poor PO, Wound healing  ASSESSMENT:   84 year old female with PMHx of advanced Alzheimer's dementia, hx TBI, HLD, anxiety, hypothyroidism who presented with back pain and AMS found to have UTI and hypernatremia due to dehydration from poor PO intake.   -Following SLP evaluation on 6/15 patient was put on dysphagia 2 diet with thin liquids. No straws at meals.  Met with patient at bedside. She was unable to provide any nutrition or weight history due to hx of advanced dementia and TBI. No family members present in room at time of RD assessment. Noted patient had only had a few bites from tray. She reported she was finished with that meal. She does endorse enjoying milkshakes and ice cream. RD will provide Ensure Enlive and Magic Cup to help supplement intake.  Unsure of weight history. There is a limited weight history in chart starting on 12/01/2017, but all of the weights appear stated and not truly measured. RD was unable to get an accurate bed scale weight on patient today.  Meal Completion: mainly 0%; per chart patient had  50% of a Magic Cup last night  Medications reviewed and include: Eliquis, citalopram, levothyroxine, ceftriaxone.  Labs reviewed: Sodium 146 (trending down), Chloride 114, BUN 34.  NUTRITION - FOCUSED PHYSICAL EXAM:    Most Recent Value  Orbital Region  Moderate depletion  Upper Arm Region  Moderate depletion  Thoracic and Lumbar Region  Mild depletion  Buccal Region  Moderate depletion  Temple Region  Severe depletion  Clavicle Bone Region  Moderate depletion  Clavicle and Acromion Bone Region  Moderate depletion  Scapular Bone Region  Unable to assess  Dorsal Hand  Moderate depletion  Patellar Region  Unable to assess  Anterior Thigh Region  Unable to assess  Posterior Calf Region  Unable to assess  Edema (RD Assessment)  Mild  Hair  Reviewed  Eyes  Reviewed  Mouth  Reviewed  Skin  Reviewed [ecchymosis]  Nails  Reviewed     Diet Order:   Diet Order           DIET DYS 2 Room service appropriate? Yes; Fluid consistency: Thin  Diet effective now          EDUCATION NEEDS:   Not appropriate for education at this time  Skin:  Skin Assessment: Skin Integrity Issues: Skin Integrity Issues:: Stage I, Stage II, DTI, Unstageable DTI: left buttocks (2cm x 2cm) 100% of wound base red or granulating Stage I: right heel (3cm x 3cm) 100% of wound base red or granulating Stage II: mid buttocks non-healing (2cm x 0.2 cm x 0.1cm) Unstageable: right heel non-healing (3cm x 0.5cm); left heel  non-healing (3cm x 0.5cm)  Last BM:  PTA (05/13/2018 per chart)  Height:   Ht Readings from Last 1 Encounters:  05/14/18 5' 8"  (1.727 m)    Weight:   Wt Readings from Last 1 Encounters:  05/14/18 170 lb (77.1 kg)    Ideal Body Weight:  63.6 kg  BMI:  Body mass index is 25.85 kg/m.  Estimated Nutritional Needs:   Kcal:  6184-8592 (MSJ x 1.3-1.5)  Protein:  93-108 grams (1.2-1.4 grams/kg)  Fluid:  1.6-1.9 L/day (1 mL/kcal)  Willey Blade, MS, RD, LDN Office:  [phone] Pager: [phone] After Hours/Weekend Pager: [phone]

## 2018-05-16 NOTE — [place] FL2 (Signed)
[place] MEDICAID FL2 LEVEL OF CARE SCREENING TOOL     IDENTIFICATION  Patient Name: Deanna Mills Birthdate: 1929/12/26 Sex: female Admission Date (Current Location): 05/14/2018  [place] Number:  Chiropodist and Address:  [facility], [address], [place], [place] [address]      Provider Number: [id number]  Attending Physician Name and Address:  Houston Siren, MD  Relative Name and Phone Number:  Ahlia Lemanski [facility] [phone]) or Keyla Milone [facility] [phone])    Current Level of Care: Hospital Recommended Level of Care: Skilled Nursing Facility Prior Approval Number:    Date Approved/Denied:   PASRR Number: Pending  Discharge Plan: SNF    Current Diagnoses: Patient Active Problem List   Diagnosis Date Noted  . Pressure injury of skin 05/15/2018  . Altered mental status 05/14/2018  . Acute hypernatremia 05/14/2018    Orientation RESPIRATION BLADDER Height & Weight     Self, Place  Normal Incontinent Weight: 170 lb (77.1 kg) Height:  5\' 8"  (172.7 cm)  BEHAVIORAL SYMPTOMS/MOOD NEUROLOGICAL BOWEL NUTRITION STATUS      Incontinent Diet(Dysphagia 2)  AMBULATORY STATUS COMMUNICATION OF NEEDS Skin   Extensive Assist Verbally Normal                       Personal Care Assistance Level of Assistance  Bathing, Feeding, Dressing Bathing Assistance: Maximum assistance Feeding assistance: Limited assistance Dressing Assistance: Maximum assistance     Functional Limitations Info  Sight, Hearing, Speech Sight Info: Adequate Hearing Info: Impaired Speech Info: Adequate    SPECIAL CARE FACTORS FREQUENCY  PT (By licensed PT)     PT Frequency: 5 X per week              Contractures Contractures Info: Not present    Additional Factors Info  Code Status, Allergies, Psychotropic Code Status Info: DNR Allergies Info: Sulfa Antibiotics Psychotropic Info: Celexa, Risperdal         Current  Medications (05/16/2018):  This is the current hospital active medication list Current Facility-Administered Medications  Medication Dose Route Frequency Provider Last Rate Last Dose  . apixaban (ELIQUIS) tablet 5 mg  5 mg Oral BID Houston Siren, MD   5 mg at 05/16/18 0746  . aspirin EC tablet 81 mg  81 mg Oral Daily Houston Siren, MD   81 mg at 05/16/18 0746  . cefTRIAXone (ROCEPHIN) 1 g in sodium chloride 0.9 % 100 mL IVPB  1 g Intravenous Q24H Houston Siren, MD   Stopped at 05/15/18 1101  . citalopram (CELEXA) tablet 20 mg  20 mg Oral BID Houston Siren, MD   20 mg at 05/16/18 0746  . donepezil (ARICEPT) tablet 10 mg  10 mg Oral QHS Houston Siren, MD   10 mg at 05/15/18 2124  . ezetimibe (ZETIA) tablet 10 mg  10 mg Oral Daily Houston Siren, MD   10 mg at 05/15/18 1446  . levothyroxine (SYNTHROID, LEVOTHROID) tablet 25 mcg  25 mcg Oral QAC breakfast Houston Siren, MD   25 mcg at 05/16/18 0746  . MEDLINE mouth rinse  15 mL Mouth Rinse BID Gouru, Aruna, MD   15 mL at 05/16/18 0747  . memantine (NAMENDA) tablet 10 mg  10 mg Oral BID Houston Siren, MD   10 mg at 05/16/18 0746  . ondansetron (ZOFRAN) tablet 4 mg  4 mg Oral Q6H PRN Ramonita Lab, MD  Or  . ondansetron (ZOFRAN) injection 4 mg  4 mg Intravenous Q6H PRN Gouru, Aruna, MD      . risperiDONE (RISPERDAL) tablet 2 mg  2 mg Oral Daily Houston SirenSainani, Vivek J, MD   2 mg at 05/15/18 1447     Discharge Medications: Please see discharge summary for a list of discharge medications.  Relevant Imaging Results:  Relevant Lab Results:   Additional Information SS#[id number]  Judi CongKaren M Savannah Erbe, LCSW

## 2018-05-17 LAB — BASIC METABOLIC PANEL
Anion gap: 7 (ref 5–15)
BUN: 18 mg/dL (ref 6–20)
CHLORIDE: 111 mmol/L (ref 101–111)
CO2: 25 mmol/L (ref 22–32)
CREATININE: 0.65 mg/dL (ref 0.44–1.00)
Calcium: 8.1 mg/dL — ABNORMAL LOW (ref 8.9–10.3)
GFR calc Af Amer: >60 mL/min (ref >60)
GFR calc non Af Amer: >60 mL/min (ref >60)
Glucose, Bld: 92 mg/dL (ref 65–99)
POTASSIUM: 3.3 mmol/L — AB (ref 3.5–5.1)
Sodium: 143 mmol/L (ref 135–145)

## 2018-05-17 LAB — CULTURE, BLOOD (ROUTINE X 2): SPECIAL REQUESTS: ADEQUATE

## 2018-05-17 MED ORDER — CIPROFLOXACIN HCL 250 MG PO TABS: 250.0000 mg | ORAL_TABLET | Freq: Two times a day (BID) | ORAL | 0 refills | Status: AC

## 2018-05-17 NOTE — Progress Notes (Signed)
Physical Therapy Treatment Patient Details Name: Deanna Mills MRN: [id number] DOB: 04-27-1930 Today's Date: 05/17/2018    History of Present Illness Pt is an 84 y.o. female with a known history of dementia from traumatic brain injury, anxiety, and hypothyroidism was brought into the ED by her sons for generalized weakness which has been getting worse and also also for altered mental status.  CT head with chronic changes, sodium was at 154, troponin was elevated 0.06 and potassium at 3.2.  Hospitalist team was called to admit the patient.  Assessment includes: hypernatremia, UTI, elevated troponin secondary to demand ischemia, AMS secondary to UTI, and hypothyroidism.      PT Comments    Pt seen this morning, sons present in room upon arrival. Pt unable to subjectively report to PT however sons say that she seems to be doing better today. Pt mod assist with short verbal cuing to perform supine there-ex, pt demonstrates the ability to follow commands by assisting mostly at onset of movements. Pt max assist +2 supine to sit EOB where she performed seated balance exercise unsupported for 1 minute with verbal cuing for anterior lean. Pt max assist +2 sitting to standing however was unable to achieve or maintain upright posture. Pt max assist +2 for scoot pivot transfer from EOB to chair. Pt able to assist requiring mod assist for UE placement for safety during transfer. Pt was positioned comfortably in chair and nursing staff was notified to use lift for return to bed. Nursing staff states that she is comfortable getting pt back to bed with lift and is agreeable doing so. Pt demonstrates deficits from PLOF including weakness for B lower extremities and decreased mobility. Pt could benefit from continued skilled therapy to return towards PLOF. PT will continue to work with pt 2x/week while admitted and d/c placement continues to be SNF.    Follow Up Recommendations  SNF     Equipment Recommendations   None recommended by PT    Recommendations for Other Services       Precautions / Restrictions Precautions Precautions: Fall Restrictions Weight Bearing Restrictions: No    Mobility  Bed Mobility Overal bed mobility: Needs Assistance Bed Mobility: Supine to Sit     Supine to sit: Max assist;+2 for physical assistance     General bed mobility comments: pt max assist +2 supine to sit. Pt able to inconsistently assist during bed mobility however once seated follows commands to lean forward and maintain seated balance.  Transfers Overall transfer level: Needs assistance Equipment used: None Transfers: Sit to/from Stand Sit to Stand: +2 physical assistance;Max assist         General transfer comment: pt required +2 max assistance for sit to stand however due to posterior lean by pt is unable to acheive or maintain full upright posture. Pt max assist +2 for scoot pivot transfer from bed to chair. Pt requires verbal cuing and onlu mod assist for UE placement for safety during transfer.  Ambulation/Gait             General Gait Details: Unable to assess at this time, due to pts inability to maintain upright standing posture when transfer sit>stand was attempted   Stairs             Wheelchair Mobility    Modified Rankin (Stroke Patients Only)       Balance Overall balance assessment: Needs assistance   Sitting balance-Leahy Scale: Good Sitting balance - Comments: pt able to maintain seated balance without upper  or lower extremity support for one minute with CGA.       Standing balance comment: Unable to assess due to pts inability to acheive upright posture                            Cognition Arousal/Alertness: Lethargic Behavior During Therapy: Flat affect Overall Cognitive Status: Impaired/Different from baseline Area of Impairment: Following commands;Awareness                       Following Commands: Follows one step commands  inconsistently              Exercises Other Exercises Other Exercises: pt instructed in supine ther-ex x10 with cuing and mod assistance including ankle pumps, SLR, hip abd, heel slides bilaterally. Other Exercises: Sitting balance exercises including sitting unsupported EOB for one minute after max assist of 2 supine to sit. transfer training from bed to chair via scoot pivot max assist of 2 verbal cuing and mod assist for UE hand placement to increase safety    General Comments        Pertinent Vitals/Pain Pain Assessment: Faces Pain Score: 0-No pain Faces Pain Scale: No hurt    Home Living                      Prior Function            PT Goals (current goals can now be found in the care plan section) Acute Rehab PT Goals PT Goal Formulation: Patient unable to participate in goal setting Time For Goal Achievement: 05/29/18 Potential to Achieve Goals: Fair Progress towards PT goals: Progressing toward goals    Frequency    Min 2X/week      PT Plan Current plan remains appropriate    Co-evaluation              AM-PAC PT "6 Clicks" Daily Activity  Outcome Measure  Difficulty turning over in bed (including adjusting bedclothes, sheets and blankets)?: Unable Difficulty moving from lying on back to sitting on the side of the bed? : Unable Difficulty sitting down on and standing up from a chair with arms (e.g., wheelchair, bedside commode, etc,.)?: Unable Help needed moving to and from a bed to chair (including a wheelchair)?: A Lot Help needed walking in hospital room?: Total Help needed climbing 3-5 steps with a railing? : Total 6 Click Score: 7    End of Session Equipment Utilized During Treatment: Gait belt Activity Tolerance: Patient tolerated treatment well Patient left: in chair;with call bell/phone within reach;with chair alarm set;with family/visitor present Nurse Communication: Mobility status(suggest to nurse using lift back into  bed) PT Visit Diagnosis: Unsteadiness on feet (R26.81);Difficulty in walking, not elsewhere classified (R26.2);Muscle weakness (generalized) (M62.81);Other abnormalities of gait and mobility (R26.89)     Time: 4782-95621051-1131 PT Time Calculation (min) (ACUTE ONLY): 40 min  Charges:                       G Codes:       Travares Nelles Elnora Morrisonhaffin, SPT    Percy Comp 05/17/2018, 12:03 PM

## 2018-05-17 NOTE — Progress Notes (Signed)
New referral for outpatient Palliative to follow at [facility] received from CSW Candace Garrison. Plan is for discharge today. Patient information faxed to referral. °Karen Robertson RN, BSN, CHPN °[facility], hospital liaison °[phone] °

## 2018-05-17 NOTE — Care Management Important Message (Signed)
Important Message  Patient Details  Name: Deanna Mills MRN: [id number] Date of Birth: [birth year]   Medicare Important Message Given:  Yes    Olegario MessierKathy A Dionisia Pacholski 05/17/2018, 11:59 AM

## 2018-05-17 NOTE — Clinical Social Work Note (Signed)
CSW met with patient and son Deanna Mills at bedside to give bed offers. Son chose [facility] for rehab. CSW notified [name] at [facility] of bed acceptance. CSW will continue to follow for discharge planning.   [place], [place]

## 2018-05-17 NOTE — Progress Notes (Signed)
Speech Language Pathology Treatment: Dysphagia  Patient Details Name: Deanna Mills MRN: [id number] DOB: 12-28-1929 Today's Date: 05/17/2018 Time: 7829-56210940-1020 SLP Time Calculation (min) (ACUTE ONLY): 40 min  Assessment / Plan / Recommendation Clinical Impression  Pt seen for ongoing assessment of pt's toleration and presentation w/ oral intake; safety and awarness of oral intake. Pt was placed on a Dysphagia level 2 (MINCED foods) diet w/ thin liquids post initial evaluation. Per NA report, pt continues to present w/ increased oral phase dysphagia (s/s) suspect impacted by her baseline declined Cognitive status d/t Dementia, old TBI. Any neurological issues can impact overall awareness and safety of oral intake and oropharyngeal swallowing. Pt exhibits both oral and pharyngeal phase dysphagia.  She was min receptive to po trials during the session often only opening lips to accept a min amount of po liquids, puree when offered. Pt appeared to want to expectorate oral/bolus residue she had been orally holding. PO trials of thin liquids were more accepted oral holding noted as well. Few pharyngeal swallows were appreciated w/ no immediate, overt s/s of aspiration noted. The above presentation is characteristic of declined Cognitive status impacting the Oral phase of swallowing - this can increase risk for pharyngeal phase deficits and declined nutritional status.  Recommend modifying the diet to a Dysphagia level 1 (puree) diet w/ thin liquids to further support oral intake in a less orally demanding way (less need for mastication and oral phase time) - until pt's Cognitive status may improve for better awareness of oral intake and swallowing.  Recommend both Dietitian and a Palliative Care consult to determine goals of care. Recommend frequent oral care for hygiene and oral stimulation; feeding support and monitoring at all meals to check for oral clearing during meals - reduce distractions during meals  to aid Cognitive focus during the meal. NSG and MD updated.     HPI HPI: Pt is a 84 y.o. female with a known history of dementia from traumatic brain injury, anxiety, hypothyroidism is brought into the ED by her sons for generalized weakness which has been getting worse and also also for altered mental status.  Son is reporting that patient has been having some choking spells lately.  CT head with chronic changes, sodium is at 154 troponin is elevated 0.06 and potassium at 3.2.  Patient has leukocytosis with Chest x-ray and urinalysis are negative.  Per NSG report, pt has been demonstrating decreased desire for oral intake often NOT accepting po's during meals - suspect this could be related to her baseline Cognitive status.       SLP Plan  Continue with current plan of care(diet modified); ST services to f/u w/ education for Family, staff       Recommendations  Diet recommendations: Thin liquid;Dysphagia 1 (puree) Liquids provided via: Teaspoon;Cup;Straw(must monitor any straw use) Medication Administration: Crushed with puree(or in liquid form) Supervision: Staff to assist with self feeding;Full supervision/cueing for compensatory strategies Compensations: Minimize environmental distractions;Slow rate;Small sips/bites;Lingual sweep for clearance of pocketing;Multiple dry swallows after each bite/sip;Follow solids with liquid Postural Changes and/or Swallow Maneuvers: Seated upright 90 degrees;Upright 30-60 min after meal                General recommendations: (Dietician f/u; Palliative Care f/u) Oral Care Recommendations: Oral care BID;Staff/trained caregiver to provide oral care Follow up Recommendations: Skilled Nursing facility(TBD) SLP Visit Diagnosis: Dysphagia, oropharyngeal phase (R13.12) Plan: Continue with current plan of care(diet modified)       GO  Deanna Mills 05/17/2018, 11:34 AM

## 2018-05-17 NOTE — Clinical Social Work Note (Signed)
Patient is medically stable for discharge to [facility] today. CSW notified patient and son Roe CoombsDon at bedside. Both are in agreement. [facility] Patient will be transported by EMS. RN to call report and call for transport.   Ruthe Mannanandace Leibish Mcgregor MSW, [address]CSWA [phone]

## 2018-05-17 NOTE — Discharge Summary (Signed)
[facility] [place] [facility]   PATIENT NAME: Deanna Mills    MR#:  [id number]  DATE OF BIRTH:  January 31, 1930  DATE OF ADMISSION:  05/14/2018 ADMITTING PHYSICIAN: Ramonita Lab, MD  DATE OF DISCHARGE: 05/17/2018  PRIMARY CARE PHYSICIAN: System, Pcp Not In    ADMISSION DIAGNOSIS:  Metabolic encephalopathy [G93.41] Lower urinary tract infectious disease [N39.0] Hypernatremia [E87.0] Disorientation [R41.0] Acute hypernatremia [E87.0]  DISCHARGE DIAGNOSIS:  Active Problems:   Altered mental status   Acute hypernatremia   Pressure injury of skin   SECONDARY DIAGNOSIS:   Past Medical History:  Diagnosis Date  . Anxiety   . Dementia   . Hyperlipidemia   . Seasonal allergies   . TBI (traumatic brain injury) (HCC)   . Thyroid disease     HOSPITAL COURSE:   84 year old female with past medical history of advanced Alzheimer's dementia, hypothyroidism, anxiety, hyperlipidemia, previous history of traumatic brain injury who presents to the hospital due to back pain, altered mental status and noted to have UTI with hypernatremia.   1.  Hypernatremia- due to dehydration from poor p.o. intake and dementia. -This has improved and resolved with IV fluid hydration and sodium is at baseline now.  2.  Urinary tract infection- was treated with IV ceftriaxone while in the hospital.  Urine cultures are positive for Citrobacter.  Patient will be discharged on oral ciprofloxacin for additional 2 days to finish 5 days of treatment.  3.  Elevated troponin-secondary to supply demand ischemia from dehydration. -Patient does not require any further intervention or work-up for this.  4.  Altered mental status-secondary to UTI and electrolyte abnormalities.   -Improved with IV fluid hydration and treatment of the underlying UTI.  5.  Dementia-we will resume her Aricept, Namenda. -Patient is at high risk for aspiration and therefore currently being discharged on a  dysphagia 1 diet with thin liquids with aspiration precautions.  6.  Hypothyroidism- pt. continue Synthroid.  7.  History of previous DVT-pt. Will cont. Her eliquis.   Given her advanced dementia patient's prognosis is poor.  She is high risk for rehospitalization due to poor p.o. intake and dehydration.  Palliative care to follow the patient at the skilled nursing facility.   DISCHARGE CONDITIONS:   Stable  CONSULTS OBTAINED:    DRUG ALLERGIES:   Allergies  Allergen Reactions  . Sulfa Antibiotics     DISCHARGE MEDICATIONS:   Allergies as of 05/17/2018      Reactions   Sulfa Antibiotics       Medication List    STOP taking these medications   cephALEXin 500 MG capsule Commonly known as:  KEFLEX     TAKE these medications   albuterol 108 (90 Base) MCG/ACT inhaler Commonly known as:  PROVENTIL HFA;VENTOLIN HFA Inhale 2 puffs into the lungs every 6 (six) hours as needed for wheezing or shortness of breath.   aspirin EC 81 MG tablet Take 81 mg by mouth daily.   carvedilol 3.125 MG tablet Commonly known as:  COREG Take 1 tablet by mouth 2 (two) times daily.   cetirizine 10 MG tablet Commonly known as:  ZYRTEC Take 1 tablet by mouth daily.   ciprofloxacin 250 MG tablet Commonly known as:  CIPRO Take 1 tablet (250 mg total) by mouth 2 (two) times daily for 2 days.   citalopram 20 MG tablet Commonly known as:  CELEXA Take 1 tablet by mouth 2 (two) times daily.   donepezil 10 MG tablet Commonly known as:  ARICEPT Take 1 tablet by mouth at bedtime.   ELIQUIS 5 MG Tabs tablet Generic drug:  apixaban Take 1 tablet by mouth 2 (two) times daily.   ezetimibe 10 MG tablet Commonly known as:  ZETIA Take 1 tablet by mouth daily.   levothyroxine 25 MCG tablet Commonly known as:  SYNTHROID, LEVOTHROID Take 1 tablet by mouth daily.   memantine 10 MG tablet Commonly known as:  NAMENDA Take 1 tablet by mouth 2 (two) times daily.   risperiDONE 2 MG  tablet Commonly known as:  RISPERDAL Take 1 tablet by mouth daily.         DISCHARGE INSTRUCTIONS:   DIET:  Cardiac diet  Dysphagia I with thin liquids and aspiration Precautions with speech to cont. To follow pt. At the facility.   DISCHARGE CONDITION:  Stable  ACTIVITY:  Activity as tolerated  OXYGEN:  Home Oxygen: No.   Oxygen Delivery: room air  DISCHARGE LOCATION:  nursing home   If you experience worsening of your admission symptoms, develop shortness of breath, life threatening emergency, suicidal or homicidal thoughts you must seek medical attention immediately by calling 911 or calling your MD immediately  if symptoms less severe.  You Must read complete instructions/literature along with all the possible adverse reactions/side effects for all the Medicines you take and that have been prescribed to you. Take any new Medicines after you have completely understood and accpet all the possible adverse reactions/side effects.   Please note  You were cared for by a hospitalist during your hospital stay. If you have any questions about your discharge medications or the care you received while you were in the hospital after you are discharged, you can call the unit and asked to speak with the hospitalist on call if the hospitalist that took care of you is not available. Once you are discharged, your primary care physician will handle any further medical issues. Please note that NO REFILLS for any discharge medications will be authorized once you are discharged, as it is imperative that you return to your primary care physician (or establish a relationship with a primary care physician if you do not have one) for your aftercare needs so that they can reassess your need for medications and monitor your lab values.     Today   Patient remains nonverbal, no acute events overnight.  Electrolytes much improved.  VITAL SIGNS:  Blood pressure 115/68, pulse 78, temperature 98.2  F (36.8 C), resp. rate 17, height 5\' 8"  (1.727 m), weight 77.1 kg (170 lb), SpO2 95 %.  I/O:    Intake/Output Summary (Last 24 hours) at 05/17/2018 1226 Last data filed at 05/17/2018 0940 Gross per 24 hour  Intake 100 ml  Output 1450 ml  Net -1350 ml    PHYSICAL EXAMINATION:   GENERAL:  84 y.o.-year-old patient lying in bed in no acute distress.  EYES: Pupils equal, round, reactive to light and accommodation. No scleral icterus. Extraocular muscles intact.  HEENT: Head atraumatic, normocephalic. Moist Oral Mucosa.  NECK:  Supple, no jugular venous distention. No thyroid enlargement, no tenderness.  LUNGS: Normal breath sounds bilaterally, no wheezing, rales, rhonchi. No use of accessory muscles of respiration.  CARDIOVASCULAR: S1, S2 normal. No murmurs, rubs, or gallops.  ABDOMEN: Soft, nontender, nondistended. Bowel sounds present. No organomegaly or mass.  EXTREMITIES: No cyanosis, clubbing or edema b/l.    NEUROLOGIC: Cranial nerves II through XII are intact. No focal Motor or sensory deficits b/l.  Globally weak. PSYCHIATRIC: The  patient is alert and oriented x 1.  SKIN: No obvious rash, lesion, or ulcer.    DATA REVIEW:   CBC Recent Labs  Lab 05/14/18 0949  WBC 14.8*  HGB 14.1  HCT 43.0  PLT 352    Chemistries  Recent Labs  Lab 05/14/18 1135  05/17/18 0359  NA 154*   < > 143  K 3.2*   < > 3.3*  CL 119*   < > 111  CO2 26   < > 25  GLUCOSE 110*   < > 92  BUN 59*   < > 18  CREATININE 1.03*   < > 0.65  CALCIUM 8.8*   < > 8.1*  AST 41  --   --   ALT 66*  --   --   ALKPHOS 65  --   --   BILITOT 2.6*  --   --    < > = values in this interval not displayed.    Cardiac Enzymes Recent Labs  Lab 05/15/18 0147  TROPONINI 0.05*    Microbiology Results  Results for orders placed or performed during the hospital encounter of 05/14/18  Blood Culture (routine x 2)     Status: Abnormal (Preliminary result)   Collection Time: 05/14/18  9:49 AM  Result Value  Ref Range Status   Specimen Description   Final    BLOOD Performed at [facility], [address], [place], [place] [address]    Special Requests   Final    Blood Culture adequate volume Performed at [facility], [address] [place], [place] [address]    Culture  Setup Time   Final    GRAM POSITIVE COCCI AEROBIC BOTTLE ONLY CRITICAL RESULT CALLED TO, READ BACK BY AND VERIFIED WITH: MATT MCBANE AT 0355 ON 05/15/18 RWW Performed at [facility], [address], [place], [place] [address]    Culture STAPHYLOCOCCUS SPECIES (COAGULASE NEGATIVE) (A)  Final   Report Status PENDING  Incomplete  Urine culture     Status: Abnormal   Collection Time: 05/14/18  9:49 AM  Result Value Ref Range Status   Specimen Description   Final    URINE, CATHETERIZED Performed at [facility], [address] [place], [place] [address]    Special Requests   Final    NONE Performed at [facility], [address] [place], [place] [address]    Culture >=100,000 COLONIES/mL CITROBACTER FREUNDII (A)  Final   Report Status 05/16/2018 FINAL  Final   Organism ID, Bacteria CITROBACTER FREUNDII (A)  Final      Susceptibility   Citrobacter freundii - MIC*    CEFAZOLIN >=64 RESISTANT Resistant     CEFTRIAXONE <=1 SENSITIVE Sensitive     CIPROFLOXACIN <=0.25 SENSITIVE Sensitive     GENTAMICIN <=1 SENSITIVE Sensitive     IMIPENEM <=0.25 SENSITIVE Sensitive     NITROFURANTOIN <=16 SENSITIVE Sensitive     TRIMETH/SULFA <=20 SENSITIVE Sensitive     PIP/TAZO <=4 SENSITIVE Sensitive     * >=100,000 COLONIES/mL CITROBACTER FREUNDII  Blood Culture ID Panel (Reflexed)     Status: Abnormal   Collection Time: 05/14/18  9:49 AM  Result Value Ref Range Status   Enterococcus species NOT DETECTED NOT DETECTED Final   Listeria monocytogenes NOT DETECTED NOT DETECTED Final   Staphylococcus species DETECTED (A) NOT DETECTED Final    Comment: Methicillin (oxacillin)  resistant coagulase negative staphylococcus. Possible blood culture contaminant (unless isolated from more  than one blood culture draw or clinical case suggests pathogenicity). No antibiotic treatment is indicated for blood  culture contaminants. CRITICAL RESULT CALLED TO, READ BACK BY AND VERIFIED WITH: MATT MCBANE AT 0355 ON 05/15/18 RWW    Staphylococcus aureus NOT DETECTED NOT DETECTED Final   Methicillin resistance DETECTED (A) NOT DETECTED Final    Comment: CRITICAL RESULT CALLED TO, READ BACK BY AND VERIFIED WITH: MATT MCBANE AT 0355 ON 05/15/18 RWW    Streptococcus species NOT DETECTED NOT DETECTED Final   Streptococcus agalactiae NOT DETECTED NOT DETECTED Final   Streptococcus pneumoniae NOT DETECTED NOT DETECTED Final   Streptococcus pyogenes NOT DETECTED NOT DETECTED Final   Acinetobacter baumannii NOT DETECTED NOT DETECTED Final   Enterobacteriaceae species NOT DETECTED NOT DETECTED Final   Enterobacter cloacae complex NOT DETECTED NOT DETECTED Final   Escherichia coli NOT DETECTED NOT DETECTED Final   Klebsiella oxytoca NOT DETECTED NOT DETECTED Final   Klebsiella pneumoniae NOT DETECTED NOT DETECTED Final   Proteus species NOT DETECTED NOT DETECTED Final   Serratia marcescens NOT DETECTED NOT DETECTED Final   Haemophilus influenzae NOT DETECTED NOT DETECTED Final   Neisseria meningitidis NOT DETECTED NOT DETECTED Final   Pseudomonas aeruginosa NOT DETECTED NOT DETECTED Final   Candida albicans NOT DETECTED NOT DETECTED Final   Candida glabrata NOT DETECTED NOT DETECTED Final   Candida krusei NOT DETECTED NOT DETECTED Final   Candida parapsilosis NOT DETECTED NOT DETECTED Final   Candida tropicalis NOT DETECTED NOT DETECTED Final    Comment: Performed at [facility], [address] [place], [place] [address]  Blood Culture (routine x 2)     Status: None (Preliminary result)   Collection Time: 05/14/18 11:35 AM  Result Value Ref Range Status   Specimen  Description BLOOD BLOOD LEFT HAND  Final   Special Requests   Final    BOTTLES DRAWN AEROBIC AND ANAEROBIC Blood Culture adequate volume   Culture   Final    NO GROWTH 3 DAYS Performed at [facility], [address] [place], [place] [address]    Report Status PENDING  Incomplete    RADIOLOGY:  No results found.    Management plans discussed with the patient, family and they are in agreement.  CODE STATUS:     Code Status Orders  (From admission, onward)        Start     Ordered   05/14/18 1339  Do not attempt resuscitation (DNR)  Continuous    Question Answer Comment  In the event of cardiac or respiratory ARREST Do not call a "code blue"   In the event of cardiac or respiratory ARREST Do not perform Intubation, CPR, defibrillation or ACLS   In the event of cardiac or respiratory ARREST Use medication by any route, position, wound care, and other measures to relive pain and suffering. May use oxygen, suction and manual treatment of airway obstruction as needed for comfort.   Comments RN may pronounce      05/14/18 1338     TOTAL TIME TAKING CARE OF THIS PATIENT: 40 minutes.    Houston Siren M.D on 05/17/2018 at 12:26 PM  Between 7am to 6pm - Pager - [phone]  After 6pm go to [email or web address] - Social research officer, government  [facility]  Office  [phone]  CC: Primary care physician; System, Pcp Not In

## 2018-05-17 NOTE — Plan of Care (Signed)

## 2018-05-19 LAB — CULTURE, BLOOD (ROUTINE X 2)
CULTURE: NO GROWTH
Special Requests: ADEQUATE

## 2018-07-01 DEATH — deceased

## 2019-12-23 IMAGING — DX DG CHEST 1V PORT
1 series · 1 of 1 positions shown · non-contrast
Comparison: Chest radiograph September 13, 2014

CLINICAL DATA: Epigastric pain and fever.  History of dementia.

EXAM:
PORTABLE CHEST 1 VIEW

[chest ap]
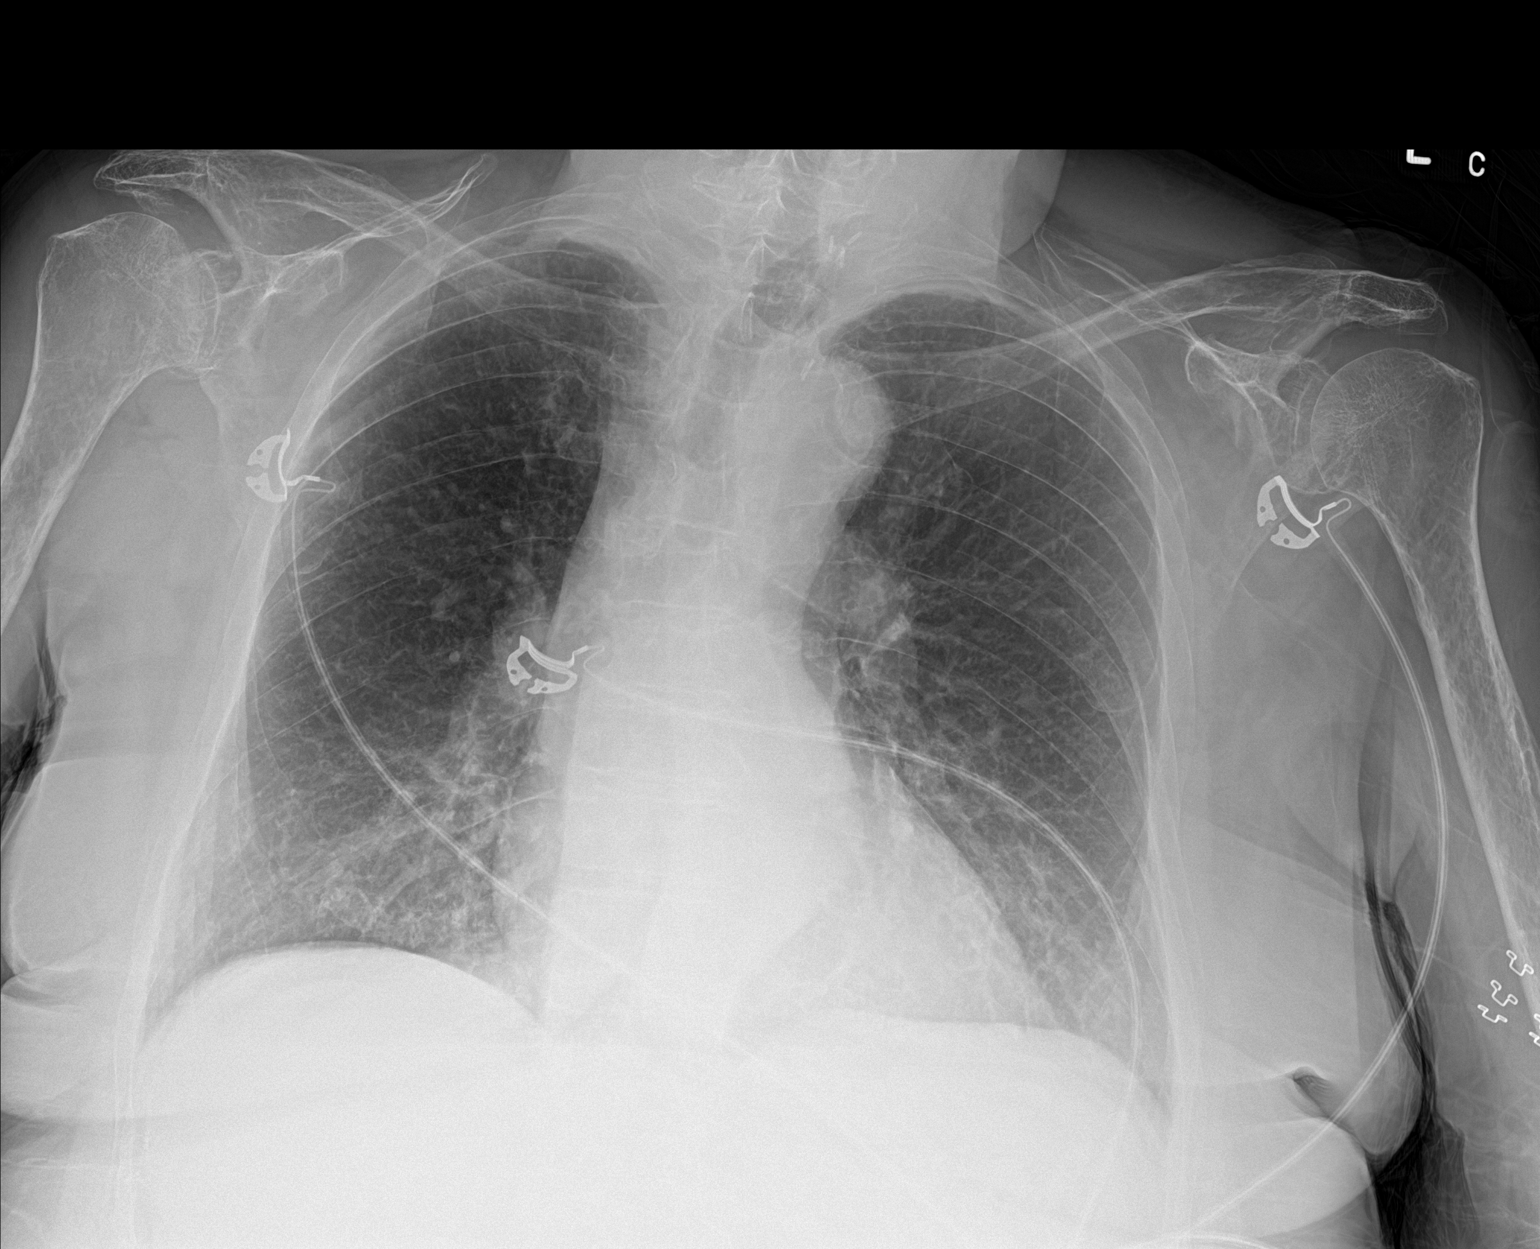

[1 of 1 positions shown; findings below may reference images not displayed]

FINDINGS: Cardiac silhouette is normal in size. Tortuous, possibly ectatic
aorta. Calcified aortic knob. Mild chronic interstitial changes
without pleural effusion or focal consolidation. Biapical pleural
thickening. No pneumothorax. Status post thyroidectomy. Osteopenia.
IMPRESSION: Mild chronic interstitial changes without acute cardiopulmonary
process.

Aortic Atherosclerosis ([id number]).

## 2020-05-23 IMAGING — CT CT HEAD W/O CM
3 series · 15 of 47 positions shown, 18 images · non-contrast
Comparison: None.

CLINICAL DATA: Altered mental status

EXAM:
CT HEAD WITHOUT CONTRAST
TECHNIQUE: Contiguous axial images were obtained from the base of the skull
through the vertex without intravenous contrast.

[Series 3: head wo · axial · 0.41mm/px · z∈[+164,+289]mm · 9 of 30 slices shown, 12 images]
[im 3/30  brain]
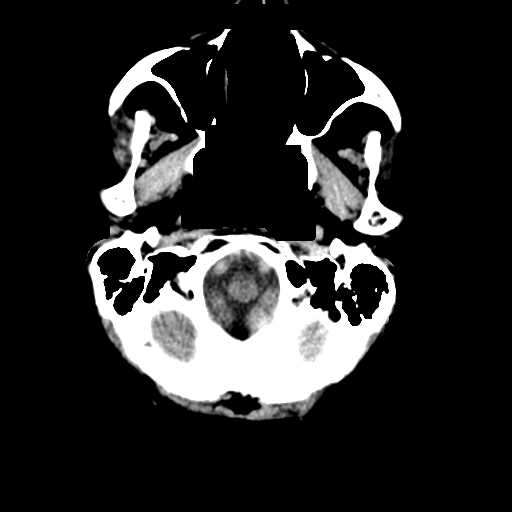
[im 3/30  bone]
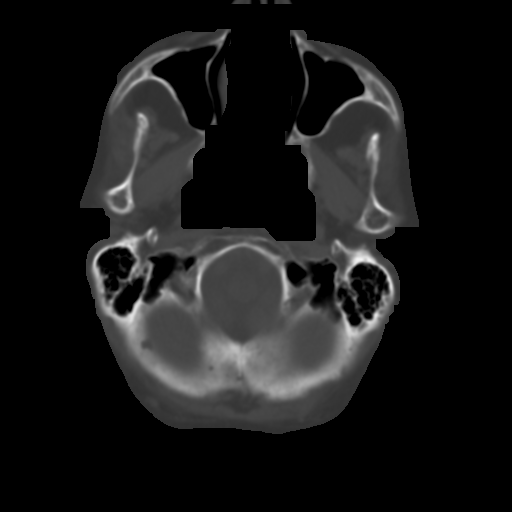
[im 6/30  brain]
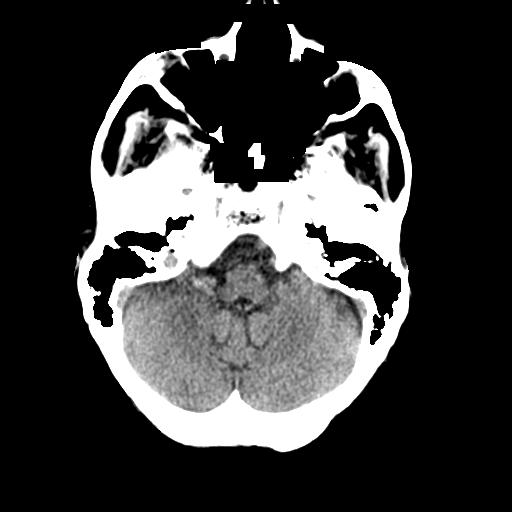
[im 9/30  brain]
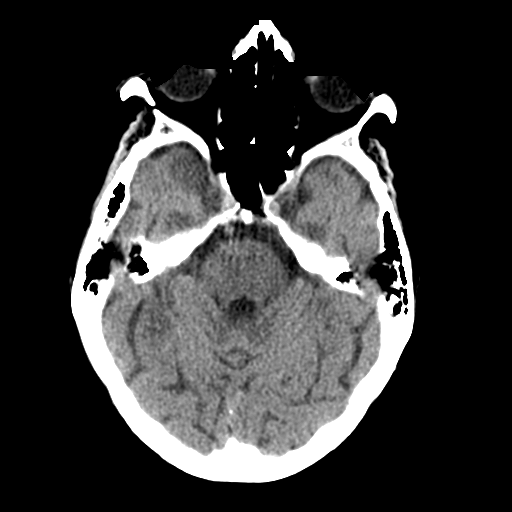
[im 12/30  brain]
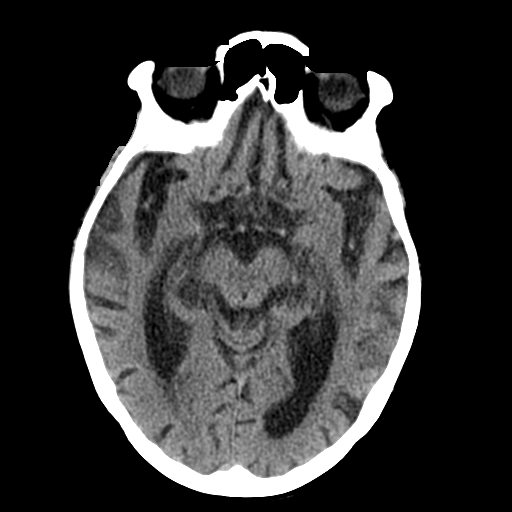
[im 16/30  brain]
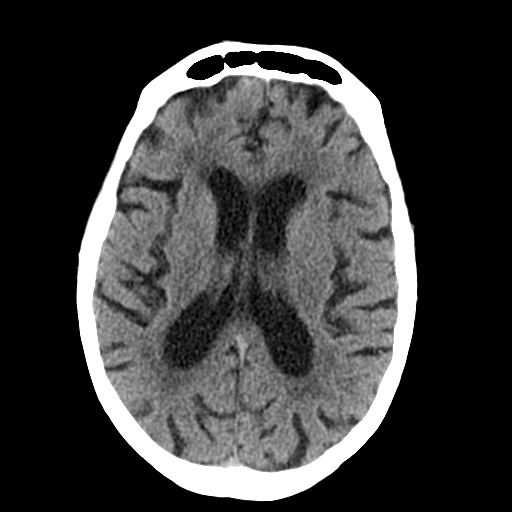
[im 16/30  bone]
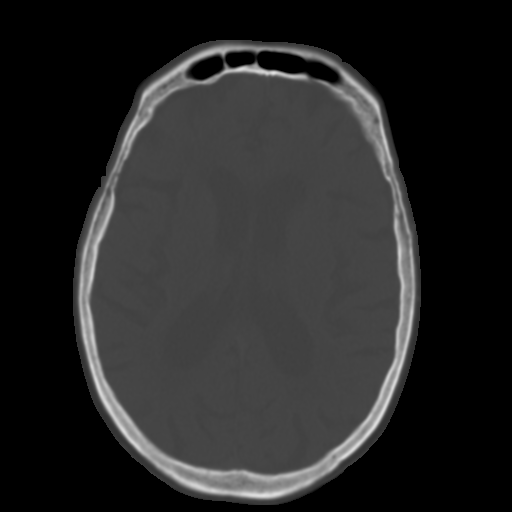
[im 19/30  brain]
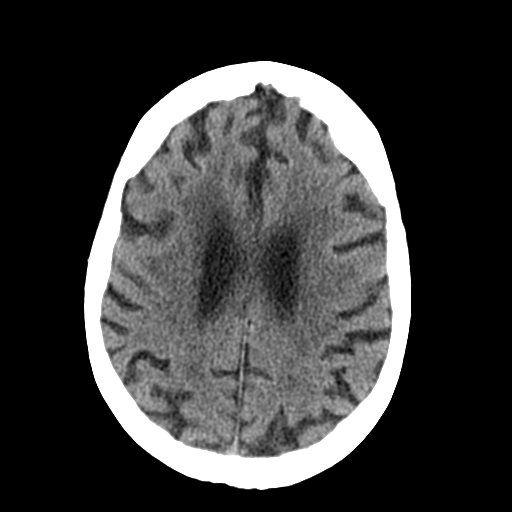
[im 22/30  brain]
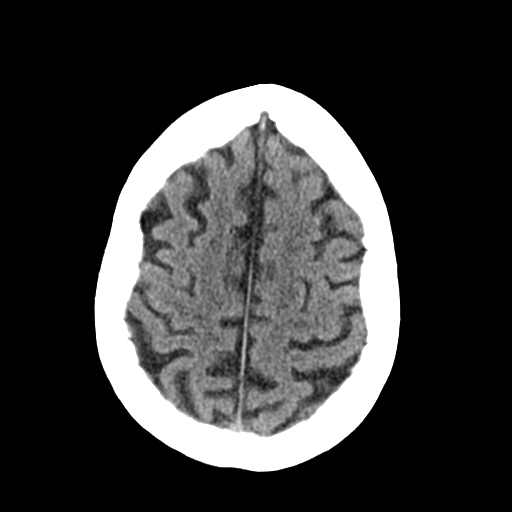
[im 25/30  brain]
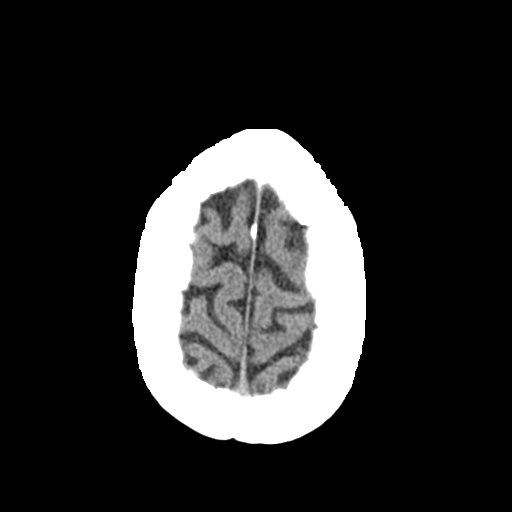
[im 28/30  brain]
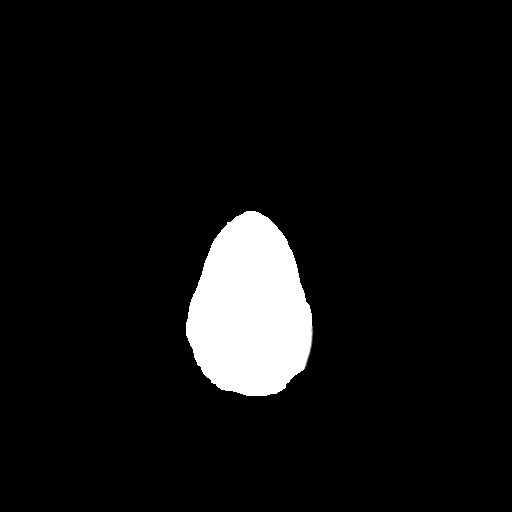
[im 28/30  bone]
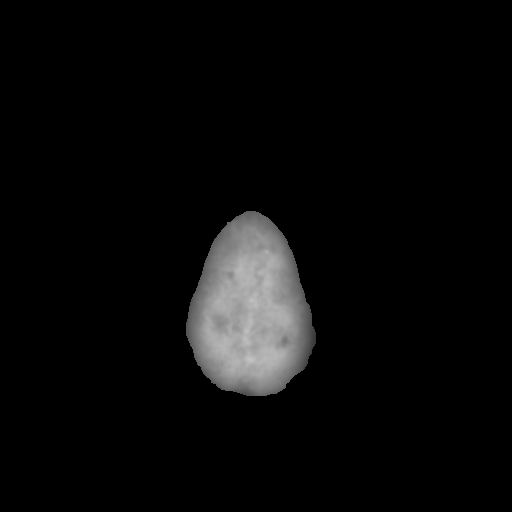

[Series 4: coronal soft tissue · coronal · 0.30mm/px · 3 of 72 slices shown]
[im 24/72  brain]
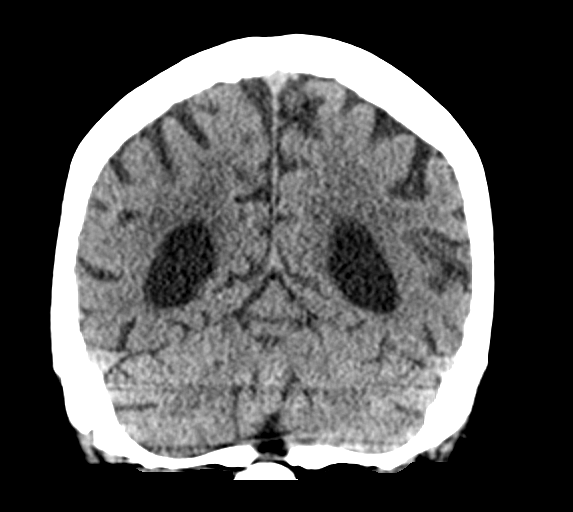
[im 32/72  brain]
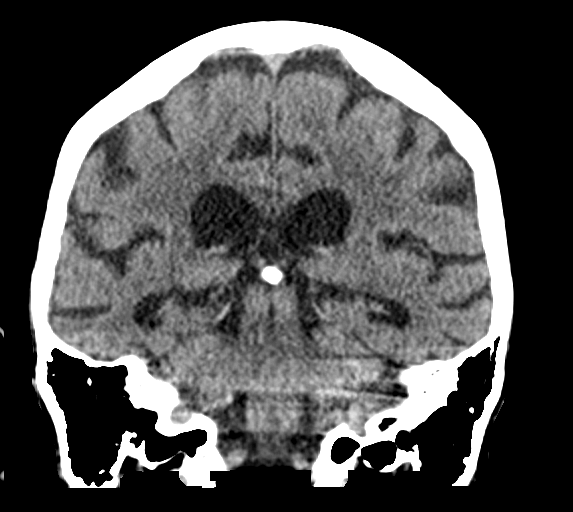
[im 40/72  brain]
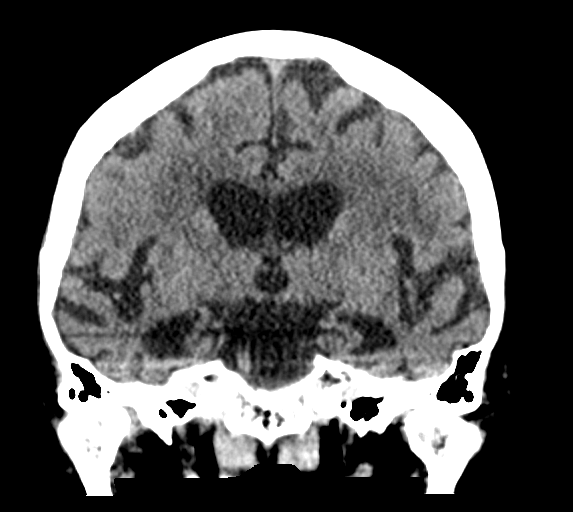

[Series 5: sagittal soft tissue · sagittal · 0.30mm/px · 3 of 55 slices shown]
[im 19/55  brain]
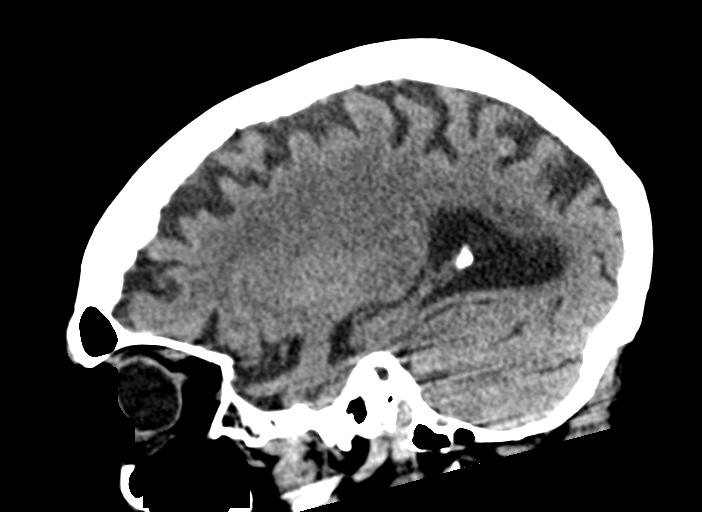
[im 28/55  brain]
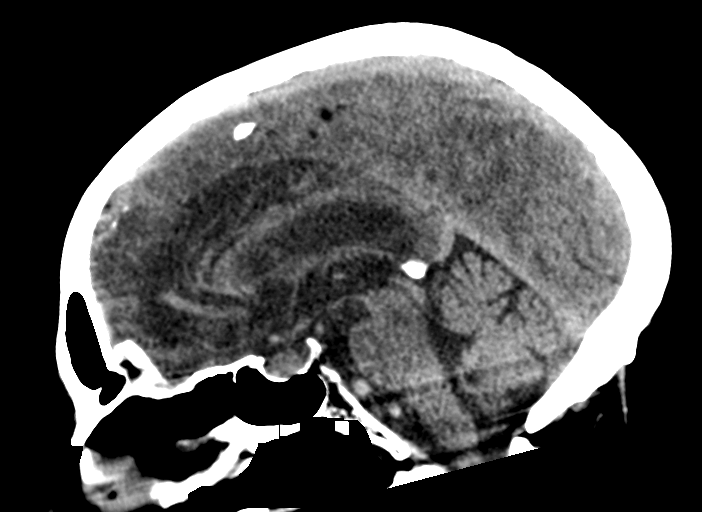
[im 37/55  brain]
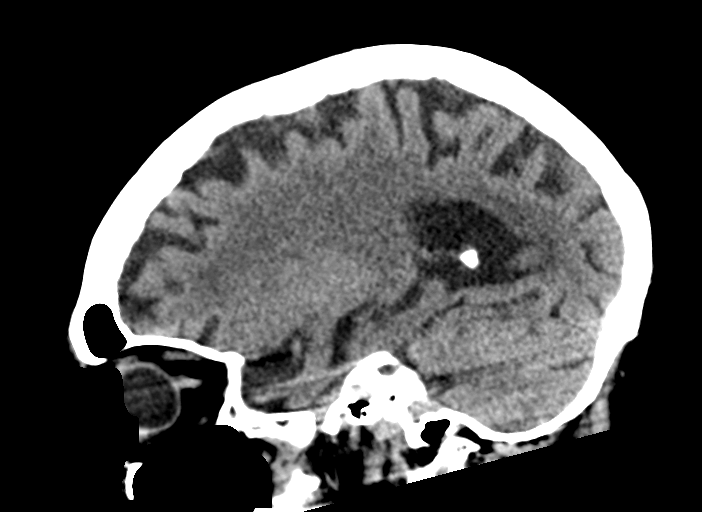

[15 of 47 positions shown; findings below may reference images not displayed]

FINDINGS: Brain: Atrophic and chronic white matter ischemic changes are noted.
No findings to suggest acute hemorrhage, acute infarction or
space-occupying mass lesion are seen.

Vascular: No hyperdense vessel or unexpected calcification.

Skull: Normal. Negative for fracture or focal lesion.

Sinuses/Orbits: No acute finding.

Other: None.
IMPRESSION: Chronic atrophic and ischemic changes without acute abnormality.
